# Patient Record
Sex: Female | Born: 1985 | Race: White | Hispanic: No | Marital: Single | Smoking: Never smoker
Health system: Southern US, Community
[De-identification: ages and names within clinical notes are randomized; demographics above are authoritative.]

## PROBLEM LIST (undated history)

## (undated) DIAGNOSIS — F32A Depression, unspecified: Secondary | ICD-10-CM

## (undated) DIAGNOSIS — T7840XA Allergy, unspecified, initial encounter: Secondary | ICD-10-CM

## (undated) DIAGNOSIS — F419 Anxiety disorder, unspecified: Secondary | ICD-10-CM

## (undated) DIAGNOSIS — G5710 Meralgia paresthetica, unspecified lower limb: Secondary | ICD-10-CM

## (undated) DIAGNOSIS — K219 Gastro-esophageal reflux disease without esophagitis: Secondary | ICD-10-CM

## (undated) DIAGNOSIS — R51 Headache: Secondary | ICD-10-CM

## (undated) DIAGNOSIS — Z309 Encounter for contraceptive management, unspecified: Secondary | ICD-10-CM

## (undated) DIAGNOSIS — R519 Headache, unspecified: Secondary | ICD-10-CM

## (undated) DIAGNOSIS — F329 Major depressive disorder, single episode, unspecified: Secondary | ICD-10-CM

## (undated) HISTORY — DX: Gastro-esophageal reflux disease without esophagitis: K21.9

## (undated) HISTORY — DX: Encounter for contraceptive management, unspecified: Z30.9

## (undated) HISTORY — DX: Depression, unspecified: F32.A

## (undated) HISTORY — PX: WISDOM TOOTH EXTRACTION: SHX21

## (undated) HISTORY — DX: Major depressive disorder, single episode, unspecified: F32.9

## (undated) HISTORY — DX: Allergy, unspecified, initial encounter: T78.40XA

## (undated) HISTORY — DX: Anxiety disorder, unspecified: F41.9

## (undated) HISTORY — DX: Meralgia paresthetica, unspecified lower limb: G57.10

## (undated) HISTORY — DX: Headache, unspecified: R51.9

## (undated) HISTORY — DX: Headache: R51

---

## 2015-11-07 ENCOUNTER — Ambulatory Visit: Payer: Self-pay | Admitting: Family Medicine

## 2015-11-11 ENCOUNTER — Ambulatory Visit (INDEPENDENT_AMBULATORY_CARE_PROVIDER_SITE_OTHER): Payer: 59 | Admitting: Family Medicine

## 2015-11-11 ENCOUNTER — Encounter: Payer: Self-pay | Admitting: Family Medicine

## 2015-11-11 VITALS — BP 122/78 | HR 90 | Temp 98.2°F | Ht 67.0 in | Wt 229.2 lb

## 2015-11-11 DIAGNOSIS — Z0000 Encounter for general adult medical examination without abnormal findings: Secondary | ICD-10-CM | POA: Diagnosis not present

## 2015-11-11 DIAGNOSIS — E669 Obesity, unspecified: Secondary | ICD-10-CM | POA: Diagnosis not present

## 2015-11-11 DIAGNOSIS — Z130 Encounter for screening for diseases of the blood and blood-forming organs and certain disorders involving the immune mechanism: Secondary | ICD-10-CM

## 2015-11-11 LAB — HEMOGLOBIN A1C: Hgb A1c MFr Bld: 5.6 % (ref 4.6–6.5)

## 2015-11-11 LAB — CBC
HCT: 37 % (ref 36.0–46.0)
HEMOGLOBIN: 12.6 g/dL (ref 12.0–15.0)
MCHC: 34.1 g/dL (ref 30.0–36.0)
MCV: 90.5 fl (ref 78.0–100.0)
Platelets: 354 10*3/uL (ref 150.0–400.0)
RBC: 4.08 Mil/uL (ref 3.87–5.11)
RDW: 12.3 % (ref 11.5–15.5)
WBC: 6.8 10*3/uL (ref 4.0–10.5)

## 2015-11-11 LAB — COMPREHENSIVE METABOLIC PANEL
ALT: 25 U/L (ref 0–35)
AST: 18 U/L (ref 0–37)
Albumin: 4 g/dL (ref 3.5–5.2)
Alkaline Phosphatase: 62 U/L (ref 39–117)
BILIRUBIN TOTAL: 0.2 mg/dL (ref 0.2–1.2)
BUN: 16 mg/dL (ref 6–23)
CO2: 30 meq/L (ref 19–32)
Calcium: 9.4 mg/dL (ref 8.4–10.5)
Chloride: 104 mEq/L (ref 96–112)
Creatinine, Ser: 0.54 mg/dL (ref 0.40–1.20)
GFR: 142.7 mL/min (ref >60.00)
Glucose, Bld: 93 mg/dL (ref 70–99)
Potassium: 4 mEq/L (ref 3.5–5.1)
SODIUM: 139 meq/L (ref 135–145)
Total Protein: 7.2 g/dL (ref 6.0–8.3)

## 2015-11-11 LAB — LIPID PANEL
Cholesterol: 139 mg/dL (ref 0–200)
HDL: 48 mg/dL (ref >39.00)
LDL Cholesterol: 80 mg/dL (ref 0–99)
NONHDL: 91.2
Total CHOL/HDL Ratio: 3
Triglycerides: 55 mg/dL (ref 0.0–149.0)
VLDL: 11 mg/dL (ref 0.0–40.0)

## 2015-11-11 NOTE — Patient Instructions (Addendum)
It was nice to see you today.  Please follow up later this year for your pap smear.  We will call with your lab results.   Follow up:  Return for Later this year for pap smear.  Take care  Dr. Lacinda Axon  Health Maintenance, Female Adopting a healthy lifestyle and getting preventive care can go a long way to promote health and wellness. Talk with your health care provider about what schedule of regular examinations is right for you. This is a good chance for you to check in with your provider about disease prevention and staying healthy. In between checkups, there are plenty of things you can do on your own. Experts have done a lot of research about which lifestyle changes and preventive measures are most likely to keep you healthy. Ask your health care provider for more information. WEIGHT AND DIET  Eat a healthy diet  Be sure to include plenty of vegetables, fruits, low-fat dairy products, and lean protein.  Do not eat a lot of foods high in solid fats, added sugars, or salt.  Get regular exercise. This is one of the most important things you can do for your health.  Most adults should exercise for at least 150 minutes each week. The exercise should increase your heart rate and make you sweat (moderate-intensity exercise).  Most adults should also do strengthening exercises at least twice a week. This is in addition to the moderate-intensity exercise.  Maintain a healthy weight  Body mass index (BMI) is a measurement that can be used to identify possible weight problems. It estimates body fat based on height and weight. Your health care provider can help determine your BMI and help you achieve or maintain a healthy weight.  For females 89 years of age and older:   A BMI below 18.5 is considered underweight.  A BMI of 18.5 to 24.9 is normal.  A BMI of 25 to 29.9 is considered overweight.  A BMI of 30 and above is considered obese.  Watch levels of cholesterol and blood  lipids  You should start having your blood tested for lipids and cholesterol at 28 years of age, then have this test every 5 years.  You may need to have your cholesterol levels checked more often if:  Your lipid or cholesterol levels are high.  You are older than 28 years of age.  You are at high risk for heart disease.  CANCER SCREENING   Lung Cancer  Lung cancer screening is recommended for adults 16-64 years old who are at high risk for lung cancer because of a history of smoking.  A yearly low-dose CT scan of the lungs is recommended for people who:  Currently smoke.  Have quit within the past 15 years.  Have at least a 30-pack-year history of smoking. A pack year is smoking an average of one pack of cigarettes a day for 1 year.  Yearly screening should continue until it has been 15 years since you quit.  Yearly screening should stop if you develop a health problem that would prevent you from having lung cancer treatment.  Breast Cancer  Practice breast self-awareness. This means understanding how your breasts normally appear and feel.  It also means doing regular breast self-exams. Let your health care provider know about any changes, no matter how small.  If you are in your 20s or 30s, you should have a clinical breast exam (CBE) by a health care provider every 1-3 years as part of a  regular health exam.  If you are 40 or older, have a CBE every year. Also consider having a breast X-ray (mammogram) every year.  If you have a family history of breast cancer, talk to your health care provider about genetic screening.  If you are at high risk for breast cancer, talk to your health care provider about having an MRI and a mammogram every year.  Breast cancer gene (BRCA) assessment is recommended for women who have family members with BRCA-related cancers. BRCA-related cancers include:  Breast.  Ovarian.  Tubal.  Peritoneal cancers.  Results of the assessment  will determine the need for genetic counseling and BRCA1 and BRCA2 testing. Cervical Cancer Your health care provider may recommend that you be screened regularly for cancer of the pelvic organs (ovaries, uterus, and vagina). This screening involves a pelvic examination, including checking for microscopic changes to the surface of your cervix (Pap test). You may be encouraged to have this screening done every 3 years, beginning at age 84.  For women ages 99-65, health care providers may recommend pelvic exams and Pap testing every 3 years, or they may recommend the Pap and pelvic exam, combined with testing for human papilloma virus (HPV), every 5 years. Some types of HPV increase your risk of cervical cancer. Testing for HPV may also be done on women of any age with unclear Pap test results.  Other health care providers may not recommend any screening for nonpregnant women who are considered low risk for pelvic cancer and who do not have symptoms. Ask your health care provider if a screening pelvic exam is right for you.  If you have had past treatment for cervical cancer or a condition that could lead to cancer, you need Pap tests and screening for cancer for at least 20 years after your treatment. If Pap tests have been discontinued, your risk factors (such as having a new sexual partner) need to be reassessed to determine if screening should resume. Some women have medical problems that increase the chance of getting cervical cancer. In these cases, your health care provider may recommend more frequent screening and Pap tests. Colorectal Cancer  This type of cancer can be detected and often prevented.  Routine colorectal cancer screening usually begins at 28 years of age and continues through 28 years of age.  Your health care provider may recommend screening at an earlier age if you have risk factors for colon cancer.  Your health care provider may also recommend using home test kits to check  for hidden blood in the stool.  A small camera at the end of a tube can be used to examine your colon directly (sigmoidoscopy or colonoscopy). This is done to check for the earliest forms of colorectal cancer.  Routine screening usually begins at age 6.  Direct examination of the colon should be repeated every 5-10 years through 28 years of age. However, you may need to be screened more often if early forms of precancerous polyps or small growths are found. Skin Cancer  Check your skin from head to toe regularly.  Tell your health care provider about any new moles or changes in moles, especially if there is a change in a mole's shape or color.  Also tell your health care provider if you have a mole that is larger than the size of a pencil eraser.  Always use sunscreen. Apply sunscreen liberally and repeatedly throughout the day.  Protect yourself by wearing long sleeves, pants, a wide-brimmed hat,  and sunglasses whenever you are outside. HEART DISEASE, DIABETES, AND HIGH BLOOD PRESSURE   High blood pressure causes heart disease and increases the risk of stroke. High blood pressure is more likely to develop in:  People who have blood pressure in the high end of the normal range (130-139/85-89 mm Hg).  People who are overweight or obese.  People who are African American.  If you are 39-52 years of age, have your blood pressure checked every 3-5 years. If you are 19 years of age or older, have your blood pressure checked every year. You should have your blood pressure measured twice--once when you are at a hospital or clinic, and once when you are not at a hospital or clinic. Record the average of the two measurements. To check your blood pressure when you are not at a hospital or clinic, you can use:  An automated blood pressure machine at a pharmacy.  A home blood pressure monitor.  If you are between 31 years and 2 years old, ask your health care provider if you should take  aspirin to prevent strokes.  Have regular diabetes screenings. This involves taking a blood sample to check your fasting blood sugar level.  If you are at a normal weight and have a low risk for diabetes, have this test once every three years after 29 years of age.  If you are overweight and have a high risk for diabetes, consider being tested at a younger age or more often. PREVENTING INFECTION  Hepatitis B  If you have a higher risk for hepatitis B, you should be screened for this virus. You are considered at high risk for hepatitis B if:  You were born in a country where hepatitis B is common. Ask your health care provider which countries are considered high risk.  Your parents were born in a high-risk country, and you have not been immunized against hepatitis B (hepatitis B vaccine).  You have HIV or AIDS.  You use needles to inject street drugs.  You live with someone who has hepatitis B.  You have had sex with someone who has hepatitis B.  You get hemodialysis treatment.  You take certain medicines for conditions, including cancer, organ transplantation, and autoimmune conditions. Hepatitis C  Blood testing is recommended for:  Everyone born from [birth year] through 1963.  Anyone with known risk factors for hepatitis C. Sexually transmitted infections (STIs)  You should be screened for sexually transmitted infections (STIs) including gonorrhea and chlamydia if:  You are sexually active and are younger than 28 years of age.  You are older than 28 years of age and your health care provider tells you that you are at risk for this type of infection.  Your sexual activity has changed since you were last screened and you are at an increased risk for chlamydia or gonorrhea. Ask your health care provider if you are at risk.  If you do not have HIV, but are at risk, it may be recommended that you take a prescription medicine daily to prevent HIV infection. This is called  pre-exposure prophylaxis (PrEP). You are considered at risk if:  You are sexually active and do not regularly use condoms or know the HIV status of your partner(s).  You take drugs by injection.  You are sexually active with a partner who has HIV. Talk with your health care provider about whether you are at high risk of being infected with HIV. If you choose to begin PrEP, you should  first be tested for HIV. You should then be tested every 3 months for as long as you are taking PrEP.  PREGNANCY   If you are premenopausal and you may become pregnant, ask your health care provider about preconception counseling.  If you may become pregnant, take 400 to 800 micrograms (mcg) of folic acid every day.  If you want to prevent pregnancy, talk to your health care provider about birth control (contraception). OSTEOPOROSIS AND MENOPAUSE   Osteoporosis is a disease in which the bones lose minerals and strength with aging. This can result in serious bone fractures. Your risk for osteoporosis can be identified using a bone density scan.  If you are 2 years of age or older, or if you are at risk for osteoporosis and fractures, ask your health care provider if you should be screened.  Ask your health care provider whether you should take a calcium or vitamin D supplement to lower your risk for osteoporosis.  Menopause may have certain physical symptoms and risks.  Hormone replacement therapy may reduce some of these symptoms and risks. Talk to your health care provider about whether hormone replacement therapy is right for you.  HOME CARE INSTRUCTIONS   Schedule regular health, dental, and eye exams.  Stay current with your immunizations.   Do not use any tobacco products including cigarettes, chewing tobacco, or electronic cigarettes.  If you are pregnant, do not drink alcohol.  If you are breastfeeding, limit how much and how often you drink alcohol.  Limit alcohol intake to no more than 1  drink per day for nonpregnant women. One drink equals 12 ounces of beer, 5 ounces of wine, or 1 ounces of hard liquor.  Do not use street drugs.  Do not share needles.  Ask your health care provider for help if you need support or information about quitting drugs.  Tell your health care provider if you often feel depressed.  Tell your health care provider if you have ever been abused or do not feel safe at home.   This information is not intended to replace advice given to you by your health care provider. Make sure you discuss any questions you have with your health care provider.   Document Released: 02/08/2011 Document Revised: 08/16/2014 Document Reviewed: 06/27/2013 Elsevier Interactive Patient Education [employer].

## 2015-11-11 NOTE — Progress Notes (Signed)
Pre visit review using our clinic review tool, if applicable. No additional management support is needed unless otherwise documented below in the visit note. 

## 2015-11-11 NOTE — Assessment & Plan Note (Signed)
Flu up to date. Tdap up to date per report. Regular exercise. Screening labs today. Needs to follow up later this year for pap smear.

## 2015-11-11 NOTE — Progress Notes (Signed)
Subjective:  Patient ID: Kathleen Lambert, female    DOB: 06/17/1986  Age: 30 y.o. MRN: [id number]  CC: Establish care  HPI Kathleen Lambert is a 30 y.o. female presents to the clinic today to establish care.   Preventative Healthcare  Pap smear: ~2011; in need of.  Immunizations  Tetanus - Report this was in the last 10 years.  Flu - Up to date.   Labs: Screening labs today.  Exercise: yes, regular exercise.   Alcohol use: See below.   Smoking/tobacco use: See below.   Wears seat belt: yes.   PMH, Surgical Hx, Family Hx, Social History reviewed and updated as below.  Past Medical History  Diagnosis Date  . Allergy   . Frequent headaches    Past Surgical History  Procedure Laterality Date  . Wisdom tooth extraction     Family History  Problem Relation Age of Onset  . Mental illness Mother   . Hypertension Father   . Breast cancer Paternal Aunt   . Lung cancer Paternal Uncle   . Diabetes Maternal Grandmother   . Heart disease Maternal Grandfather   . Heart disease Paternal Grandfather   . Stroke Paternal Grandfather    Social History  Substance Use Topics  . Smoking status: Never Smoker   . Smokeless tobacco: Never Used  . Alcohol Use: Not on file    Review of Systems  Neurological: Positive for headaches.  All other systems reviewed and are negative.  Objective:   Today's Vitals: BP 122/78 mmHg  Pulse 90  Temp(Src) 98.2 F (36.8 C) (Oral)  Ht 5\' 7"  (1.702 m)  Wt 229 lb 4 oz (103.987 kg)  BMI 35.90 kg/m2  SpO2 97%  LMP 10/31/2015  Physical Exam  Constitutional: She is oriented to person, place, and time. She appears well-developed and well-nourished. No distress.  HENT:  Head: Normocephalic and atraumatic.  Nose: Nose normal.  Mouth/Throat: Oropharynx is clear and moist. No oropharyngeal exudate.  Normal TM's bilaterally.   Eyes: Conjunctivae are normal. No scleral icterus.  Neck: Neck supple. No thyromegaly present.  Cardiovascular:  Normal rate and regular rhythm.   No murmur heard. Pulmonary/Chest: Effort normal and breath sounds normal. She has no wheezes. She has no rales.  Abdominal: Soft. She exhibits no distension. There is no tenderness. There is no rebound and no guarding.  Musculoskeletal: Normal range of motion. She exhibits no edema.  Lymphadenopathy:    She has no cervical adenopathy.  Neurological: She is alert and oriented to person, place, and time.  Skin: Skin is warm and dry. No rash noted.  Psychiatric: She has a normal mood and affect.  Vitals reviewed.  Assessment & Plan:   Problem List Items Addressed This Visit    Preventative health care - Primary    Flu up to date. Tdap up to date per report. Regular exercise. Screening labs today. Needs to follow up later this year for pap smear.       Other Visit Diagnoses    Screening for deficiency anemia        Relevant Orders    CBC (Completed)    Obesity (BMI 35.0-39.9 without comorbidity) (HCC)        Relevant Orders    Comprehensive metabolic panel (Completed)    Lipid Profile (Completed)    HgB A1c (Completed)      Follow-up: Return for Later this year for pap smear.  Kathleen OtherJayce Alexsis Branscom DO [facility] [place]

## 2016-01-20 DIAGNOSIS — H5213 Myopia, bilateral: Secondary | ICD-10-CM | POA: Diagnosis not present

## 2016-01-20 DIAGNOSIS — H52222 Regular astigmatism, left eye: Secondary | ICD-10-CM | POA: Diagnosis not present

## 2016-02-05 ENCOUNTER — Telehealth: Payer: 59 | Admitting: Family

## 2016-02-05 DIAGNOSIS — B9689 Other specified bacterial agents as the cause of diseases classified elsewhere: Secondary | ICD-10-CM

## 2016-02-05 DIAGNOSIS — J019 Acute sinusitis, unspecified: Secondary | ICD-10-CM | POA: Diagnosis not present

## 2016-02-05 MED ORDER — AMOXICILLIN-POT CLAVULANATE 875-125 MG PO TABS: 1.0000 | ORAL_TABLET | Freq: Two times a day (BID) | ORAL | Status: DC

## 2016-02-05 MED ORDER — DOXYCYCLINE HYCLATE 100 MG PO TABS: 100.0000 mg | ORAL_TABLET | Freq: Two times a day (BID) | ORAL | Status: DC

## 2016-02-05 NOTE — Addendum Note (Signed)
Addended by: Jannifer RodneyHAWKS, Teryl Mcconaghy A on: 02/05/2016 09:42 PM   Modules accepted: Orders

## 2016-02-05 NOTE — Progress Notes (Signed)

## 2016-02-09 ENCOUNTER — Ambulatory Visit (INDEPENDENT_AMBULATORY_CARE_PROVIDER_SITE_OTHER): Payer: 59 | Admitting: Family Medicine

## 2016-02-09 ENCOUNTER — Other Ambulatory Visit (HOSPITAL_COMMUNITY)
Admission: RE | Admit: 2016-02-09 | Discharge: 2016-02-09 | Disposition: A | Discharge status: Home / Self Care | Payer: 59 | Source: Ambulatory Visit | Attending: Family Medicine | Admitting: Family Medicine

## 2016-02-09 ENCOUNTER — Encounter: Payer: Self-pay | Admitting: Family Medicine

## 2016-02-09 VITALS — BP 126/74 | HR 48 | Temp 98.4°F | Wt 232.0 lb

## 2016-02-09 DIAGNOSIS — Z1151 Encounter for screening for human papillomavirus (HPV): Secondary | ICD-10-CM | POA: Insufficient documentation

## 2016-02-09 DIAGNOSIS — N649 Disorder of breast, unspecified: Secondary | ICD-10-CM

## 2016-02-09 DIAGNOSIS — N6459 Other signs and symptoms in breast: Secondary | ICD-10-CM

## 2016-02-09 DIAGNOSIS — Z01419 Encounter for gynecological examination (general) (routine) without abnormal findings: Secondary | ICD-10-CM | POA: Insufficient documentation

## 2016-02-09 DIAGNOSIS — Z124 Encounter for screening for malignant neoplasm of cervix: Secondary | ICD-10-CM

## 2016-02-09 NOTE — Progress Notes (Signed)
Pre visit review using our clinic review tool, if applicable. No additional management support is needed unless otherwise documented below in the visit note. 

## 2016-02-09 NOTE — Patient Instructions (Signed)
We will call with your pap results.  Nothing to worry about regarding your breasts.  Follow up annually  Take care  Dr. Adriana Simasook

## 2016-02-09 NOTE — Progress Notes (Signed)
   Subjective:  Patient ID: Kathleen Lambert, female    DOB: 11/05/1985  Age: 30 y.o. MRN: [id number]  CC: Pap smear, Breast concern  HPI:  30 year old female presents for a pap smear. Patient also has a concern about her left breast.  Last Pap smear was in 2011. She is in need of Pap smear today.  Patient reports that for the past few weeks she's noticed some dimpling of an area of her left breast. No reports of mass/lump, however patient states is difficult to discern as she has dense breasts. No reported nipple discharge. No reports of pain. No fevers, chills, weight loss. No other complaints today.  Social Hx   Social History   Social History  . Marital Status: Single    Spouse Name: N/A  . Number of Children: N/A  . Years of Education: N/A   Social History Main Topics  . Smoking status: Never Smoker   . Smokeless tobacco: Never Used  . Alcohol Use: None  . Drug Use: No  . Sexual Activity: Not Currently   Other Topics Concern  . None   Social History Narrative   Review of Systems  Constitutional: Negative.   Skin:       Breast dimpling (left).   Objective:  BP 126/74 mmHg  Pulse 48  Temp(Src) 98.4 F (36.9 C) (Oral)  Wt 232 lb (105.235 kg)  SpO2 98%  LMP 12/31/2015  BP/Weight 02/09/2016 11/11/2015  Systolic BP 126 122  Diastolic BP 74 78  Wt. (Lbs) 232 229.25  BMI 36.33 35.9   Physical Exam  Constitutional: She is oriented to person, place, and time. She appears well-developed. No distress.  Cardiovascular: Normal rate and regular rhythm.   Breasts: breasts appear normal, no appreciable masses, no skin or nipple changes or axillary nodes.    Pulmonary/Chest: Effort normal. She has no wheezes. She has no rales.  Genitourinary:  Pelvic Exam: External: normal female genitalia without lesions or masses Vagina: normal without lesions or masses Cervix: normal without lesions or masses. Pap smear: performed   Neurological: She is alert and oriented to person,  place, and time.  Psychiatric: She has a normal mood and affect.  Vitals reviewed.  Lab Results  Component Value Date   WBC 6.8 11/11/2015   HGB 12.6 11/11/2015   HCT 37.0 11/11/2015   PLT 354.0 11/11/2015   GLUCOSE 93 11/11/2015   CHOL 139 11/11/2015   TRIG 55.0 11/11/2015   HDL 48.00 11/11/2015   LDLCALC 80 11/11/2015   ALT 25 11/11/2015   AST 18 11/11/2015   NA 139 11/11/2015   K 4.0 11/11/2015   CL 104 11/11/2015   CREATININE 0.54 11/11/2015   BUN 16 11/11/2015   CO2 30 11/11/2015   HGBA1C 5.6 11/11/2015    Assessment & Plan:   Problem List Items Addressed This Visit    Breast complaint - Primary    New problem. Normal breast exam today. Reassured.      Screening for malignant neoplasm of cervix    Pap smear performed today.      Relevant Orders   Cytology - PAP     Follow-up: Annually  Everlene OtherJayce Barabara Motz DO [facility] [place]

## 2016-02-10 DIAGNOSIS — N6459 Other signs and symptoms in breast: Secondary | ICD-10-CM | POA: Insufficient documentation

## 2016-02-11 DIAGNOSIS — Z124 Encounter for screening for malignant neoplasm of cervix: Secondary | ICD-10-CM | POA: Insufficient documentation

## 2016-02-11 NOTE — Assessment & Plan Note (Signed)
New problem. Normal breast exam today. Reassured.

## 2016-02-11 NOTE — Assessment & Plan Note (Signed)
Pap smear performed today. 

## 2016-02-12 LAB — CYTOLOGY - PAP

## 2016-05-16 ENCOUNTER — Telehealth: Payer: 59 | Admitting: Family

## 2016-05-16 DIAGNOSIS — R112 Nausea with vomiting, unspecified: Secondary | ICD-10-CM

## 2016-05-16 MED ORDER — ONDANSETRON 4 MG PO TBDP: 4.0000 mg | ORAL_TABLET | Freq: Three times a day (TID) | ORAL | 0 refills | Status: DC | PRN

## 2016-05-16 NOTE — Progress Notes (Signed)

## 2016-06-11 ENCOUNTER — Ambulatory Visit (INDEPENDENT_AMBULATORY_CARE_PROVIDER_SITE_OTHER): Payer: 59

## 2016-06-11 ENCOUNTER — Ambulatory Visit (INDEPENDENT_AMBULATORY_CARE_PROVIDER_SITE_OTHER): Payer: 59 | Admitting: Podiatry

## 2016-06-11 VITALS — BP 109/64 | HR 70 | Temp 98.1°F | Resp 16 | Ht 68.0 in | Wt 235.0 lb

## 2016-06-11 DIAGNOSIS — M7672 Peroneal tendinitis, left leg: Secondary | ICD-10-CM

## 2016-06-11 DIAGNOSIS — M722 Plantar fascial fibromatosis: Secondary | ICD-10-CM | POA: Diagnosis not present

## 2016-06-11 DIAGNOSIS — R52 Pain, unspecified: Secondary | ICD-10-CM

## 2016-06-11 DIAGNOSIS — M79673 Pain in unspecified foot: Secondary | ICD-10-CM | POA: Diagnosis not present

## 2016-06-11 DIAGNOSIS — M7751 Other enthesopathy of right foot: Secondary | ICD-10-CM

## 2016-06-11 DIAGNOSIS — M79671 Pain in right foot: Secondary | ICD-10-CM

## 2016-06-11 NOTE — Progress Notes (Signed)
   Subjective:    Patient ID: Kathleen GoldsAlicia A Nied, female    DOB: 08/22/1985, 30 y.o.   MRN: [id number]  HPI    Review of Systems  All other systems reviewed and are negative.      Objective:   Physical Exam        Assessment & Plan:

## 2016-06-12 MED ORDER — BETAMETHASONE SOD PHOS & ACET 6 (3-3) MG/ML IJ SUSP: 3.0000 mg | Freq: Once | INTRAMUSCULAR | Status: DC

## 2016-06-12 NOTE — Progress Notes (Signed)
Patient ID: Kathleen GoldsAlicia [name], female   DOB: 06/23/1986, 30 y.o.   MRN: [id number] Subjective: Patient presents today for pain and tenderness in the right foot. Patient states the foot pain has been hurting for several weeks now. Patient states that it hurts in the mornings with the first steps out of bed. Patient also states that she has significant pain to the lateral aspect of the left foot. Pain is been ongoing for several weeks now. Patient presents today for further treatment and evaluation  Objective: Physical Exam General: The patient is alert and oriented x3 in no acute distress.  Dermatology: Skin is warm, dry and supple bilateral lower extremities. Negative for open lesions or macerations bilateral.   Vascular: Dorsalis Pedis and Posterior Tibial pulses palpable bilateral.  Capillary fill time is immediate to all digits.  Neurological: Epicritic and protective threshold intact bilateral.   Musculoskeletal: Tenderness to palpation at the medial calcaneal tubercale and through the insertion of the plantar fascia of the right foot.  Pain on palpation also noted to the insertion of the peroneal brevis tendon at the level of the fifth metatarsal tubercle left foot.  All other joints range of motion within normal limits bilateral. Strength 5/5 in all groups bilateral.    Assessment: 1. Plantar fasciitis right 2. Pain in right foot 3. Peroneal brevis enthesopathy left 4. Pain in left foot  Plan of Care:  1. Patient evaluated. Xrays reviewed.   2. Injection of 0.5cc Celestone soluspan injected into the right heel at the insertion of the plantar fascia.  3. Instructed patient regarding therapies and modalities at home to alleviate symptoms.  4. Injection of 0.5 mL Celestone Soluspan injected into the peroneal brevis tendon sheath at the insertion of the fifth metatarsal tubercle left foot. 6. Plantar fascial band(s) dispensed. 7. Today the patient was scanned for custom molded  orthotics. 8. Return to clinic in 4 weeks.

## 2016-07-06 ENCOUNTER — Ambulatory Visit (INDEPENDENT_AMBULATORY_CARE_PROVIDER_SITE_OTHER): Payer: 59 | Admitting: Podiatry

## 2016-07-06 DIAGNOSIS — M722 Plantar fascial fibromatosis: Secondary | ICD-10-CM

## 2016-07-06 NOTE — Patient Instructions (Signed)

## 2016-07-11 NOTE — Progress Notes (Signed)
Patient presents for orthotics pickup. Orthotics were dispensed today with both verbal and written instructions for orthotics break-in period and care. Return to clinic in 3 weeks for follow-up evaluation.  Brent M. Evans, DPM [facility]  Dr. Brent M. Evans, DPM   [address]                                        [place], [place] [address]                Office [phone]  Fax [phone]   

## 2016-07-12 ENCOUNTER — Encounter: Payer: Self-pay | Admitting: *Deleted

## 2016-08-03 ENCOUNTER — Encounter: Payer: Self-pay | Admitting: Podiatry

## 2016-08-03 ENCOUNTER — Ambulatory Visit (INDEPENDENT_AMBULATORY_CARE_PROVIDER_SITE_OTHER): Payer: 59 | Admitting: Podiatry

## 2016-08-03 DIAGNOSIS — M7672 Peroneal tendinitis, left leg: Secondary | ICD-10-CM | POA: Diagnosis not present

## 2016-08-03 DIAGNOSIS — M79671 Pain in right foot: Secondary | ICD-10-CM

## 2016-08-03 DIAGNOSIS — M7751 Other enthesopathy of right foot: Secondary | ICD-10-CM | POA: Diagnosis not present

## 2016-08-03 DIAGNOSIS — M722 Plantar fascial fibromatosis: Secondary | ICD-10-CM

## 2016-08-03 MED ORDER — MELOXICAM 15 MG PO TABS: 15.0000 mg | ORAL_TABLET | Freq: Every day | ORAL | 1 refills | Status: DC

## 2016-08-07 NOTE — Progress Notes (Signed)
Patient ID: Kathleen GoldsAlicia [name], female   DOB: 07/30/1986, 30 y.o.   MRN: [id number] Subjective: Patient presents today for follow-up evaluation of plantar fasciitis to the right foot as well as peroneal enthesopathy of the left foot. Patient states that the peroneal enthesopathy to the left foot is the same. The condition does not improve or worsens. Patient states that her plantar fasciitis in her left foot has healed. She states that she has approximately 75% improvement on the right foot for plantar fasciitis. Patient presents today for follow-up evaluation and treatment  Objective: Physical Exam General: The patient is alert and oriented x3 in no acute distress.  Dermatology: Skin is warm, dry and supple bilateral lower extremities. Negative for open lesions or macerations bilateral.   Vascular: Dorsalis Pedis and Posterior Tibial pulses palpable bilateral.  Capillary fill time is immediate to all digits.  Neurological: Epicritic and protective threshold intact bilateral.   Musculoskeletal: Tenderness to palpation at the medial calcaneal tubercale and through the insertion of the plantar fascia of the right foot.  Pain on palpation also noted to the insertion of the peroneal brevis tendon at the level of the fifth metatarsal tubercle left foot.  All other joints range of motion within normal limits bilateral. Strength 5/5 in all groups bilateral.    Assessment: 1. Plantar fasciitis right-improved 2. Pain in right foot 3. Peroneal brevis enthesopathy left 4. Pain in left foot  Plan of Care:  1. Patient evaluated.   2. Prescription for meloxicam 15 mg #60 one refill 3. Continue custom molded orthotics, plantar fascial band as needed 4. Return to clinic in 4 weeks  Patient is a ER nurse at the [employer]

## 2016-08-31 ENCOUNTER — Ambulatory Visit (INDEPENDENT_AMBULATORY_CARE_PROVIDER_SITE_OTHER): Payer: 59 | Admitting: Podiatry

## 2016-08-31 DIAGNOSIS — M722 Plantar fascial fibromatosis: Secondary | ICD-10-CM | POA: Diagnosis not present

## 2016-08-31 DIAGNOSIS — M7672 Peroneal tendinitis, left leg: Secondary | ICD-10-CM | POA: Diagnosis not present

## 2016-08-31 NOTE — Progress Notes (Signed)
   Subjective:  Patient presents today for follow-up evaluation of plantar fasciitis to the right foot as well as peroneal brevis enthesopathy left foot. 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 Patient states she's doing a lot better. Patient states the orthotics and meloxicam has helped.    Objective/Physical Exam General: The patient is alert and oriented x3 in no acute distress.  Dermatology: Skin is warm, dry and supple bilateral lower extremities. Negative for open lesions or macerations.  Vascular: Palpable pedal pulses bilaterally. No edema or erythema noted. Capillary refill within normal limits.  Neurological: Epicritic and protective threshold grossly intact bilaterally.   Musculoskeletal Exam: Range of motion within normal limits to all pedal and ankle joints bilateral. Muscle strength 5/5 in all groups bilateral.  Negative for pain on palpation to the plantar fascia of the right foot as well as insertion of the peroneal brevis tendon left.  Assessment: #1 plantar fasciitis right foot-healed Exline #2 peroneal brevis enthesopathy left-healed   Plan of Care:  #1 Patient was evaluated. #2 return to clinic when necessary   Felecia ShellingBrent M. Jennene Downie, DPM [facility]  Dr. Felecia ShellingBrent M. Jillana Selph, DPM    [address]                                        [place], [place] [address]                Office [phone]  Fax [phone]

## 2016-10-15 ENCOUNTER — Telehealth: Payer: 59 | Admitting: Family

## 2016-10-15 DIAGNOSIS — J029 Acute pharyngitis, unspecified: Secondary | ICD-10-CM | POA: Diagnosis not present

## 2016-10-15 MED ORDER — BENZONATATE 100 MG PO CAPS: 100.0000 mg | ORAL_CAPSULE | Freq: Three times a day (TID) | ORAL | 0 refills | Status: DC | PRN

## 2016-10-15 NOTE — Progress Notes (Signed)
We are sorry that you are not feeling well.  Here is how we plan to help!  Based on what you have shared with me it looks like you have upper respiratory tract inflammation that has resulted in a significant cough.  Inflammation and infection in the upper respiratory tract is commonly called bronchitis and has four common causes:  Allergies, Viral Infections, Acid Reflux and Bacterial Infections.  Allergies, viruses and acid reflux are treated by controlling symptoms or eliminating the cause. An example might be a cough caused by taking certain blood pressure medications. You stop the cough by changing the medication. Another example might be a cough caused by acid reflux. Controlling the reflux helps control the cough.  Based on your presentation I believe you most likely have A cough due to a virus.  This is called viral bronchitis and is best treated by rest, plenty of fluids and control of the cough.  You may use Ibuprofen or Tylenol as directed to help your symptoms.     In addition you may use A non-prescription cough medication called Mucinex DM: take 2 tablets every 12 hours. and A prescription cough medication called Tessalon Perles 100mg. You may take 1-2 capsules every 8 hours as needed for your cough.   USE OF BRONCHODILATOR ("RESCUE") INHALERS: There is a risk from using your bronchodilator too frequently.  The risk is that over-reliance on a medication which only relaxes the muscles surrounding the breathing tubes can reduce the effectiveness of medications prescribed to reduce swelling and congestion of the tubes themselves.  Although you feel brief relief from the bronchodilator inhaler, your asthma may actually be worsening with the tubes becoming more swollen and filled with mucus.  This can delay other crucial treatments, such as oral steroid medications. If you need to use a bronchodilator inhaler daily, several times per day, you should discuss this with your provider.  There are  probably better treatments that could be used to keep your asthma under control.     HOME CARE . Only take medications as instructed by your medical team. . Complete the entire course of an antibiotic. . Drink plenty of fluids and get plenty of rest. . Avoid close contacts especially the very young and the elderly . Cover your mouth if you cough or cough into your sleeve. . Always remember to wash your hands . A steam or ultrasonic humidifier can help congestion.   GET HELP RIGHT AWAY IF: . You develop worsening fever. . You become short of breath . You cough up blood. . Your symptoms persist after you have completed your treatment plan MAKE SURE YOU   Understand these instructions.  Will watch your condition.  Will get help right away if you are not doing well or get worse.  Your e-visit answers were reviewed by a board certified advanced clinical practitioner to complete your personal care plan.  Depending on the condition, your plan could have included both over the counter or prescription medications. If there is a problem please reply  once you have received a response from your provider. Your safety is important to us.  If you have drug allergies check your prescription carefully.    You can use MyChart to ask questions about today's visit, request a non-urgent call back, or ask for a work or school excuse for 24 hours related to this e-Visit. If it has been greater than 24 hours you will need to follow up with your provider, or enter a new   e-Visit to address those concerns. You will get an e-mail in the next two days asking about your experience.  I hope that your e-visit has been valuable and will speed your recovery. Thank you for using e-visits.   

## 2017-01-20 ENCOUNTER — Telehealth: Payer: 59 | Admitting: Family

## 2017-01-20 DIAGNOSIS — R112 Nausea with vomiting, unspecified: Secondary | ICD-10-CM

## 2017-01-20 MED ORDER — ONDANSETRON HCL 4 MG PO TABS: 4.0000 mg | ORAL_TABLET | Freq: Three times a day (TID) | ORAL | 0 refills | Status: DC | PRN

## 2017-01-20 NOTE — Progress Notes (Signed)
Thank you for the details you put in the comment boxes. Those details really help us take better care of you.   We are sorry that you are not feeling well. Here is how we plan to help!  Based on what you have shared with me it looks like you have a Virus that is irritating your GI tract.  Vomiting is the forceful emptying of a portion of the stomach's content through the mouth.  Although nausea and vomiting can make you feel miserable, it's important to remember that these are not diseases, but rather symptoms of an underlying illness.  When we treat short term symptoms, we always caution that any symptoms that persist should be fully evaluated in a medical office.  I have prescribed a medication that will help alleviate your symptoms and allow you to stay hydrated:  Zofran 4 mg 1 tablet every 8 hours as needed for nausea and vomiting  HOME CARE:  Drink clear liquids.  This is very important! Dehydration (the lack of fluid) can lead to a serious complication.  Start off with 1 tablespoon every 5 minutes for 8 hours.  You may begin eating bland foods after 8 hours without vomiting.  Start with saltine crackers, white bread, rice, mashed potatoes, applesauce.  After 48 hours on a bland diet, you may resume a normal diet.  Try to go to sleep.  Sleep often empties the stomach and relieves the need to vomit.  GET HELP RIGHT AWAY IF:   Your symptoms do not improve or worsen within 2 days after treatment.  You have a fever for over 3 days.  You cannot keep down fluids after trying the medication.  MAKE SURE YOU:   Understand these instructions.  Will watch your condition.  Will get help right away if you are not doing well or get worse.   Thank you for choosing an e-visit. Your e-visit answers were reviewed by a board certified advanced clinical practitioner to complete your personal care plan. Depending upon the condition, your plan could have included both over the counter or  prescription medications. Please review your pharmacy choice. Be sure that the pharmacy you have chosen is open so that you can pick up your prescription now.  If there is a problem you may message your provider in MyChart to have the prescription routed to another pharmacy. Your safety is important to us. If you have drug allergies check your prescription carefully.  For the next 24 hours, you can use MyChart to ask questions about today's visit, request a non-urgent call back, or ask for a work or school excuse from your e-visit provider. You will get an e-mail in the next two days asking about your experience. I hope that your e-visit has been valuable and will speed your recovery.    

## 2017-03-01 DIAGNOSIS — H52222 Regular astigmatism, left eye: Secondary | ICD-10-CM | POA: Diagnosis not present

## 2017-03-01 DIAGNOSIS — H5213 Myopia, bilateral: Secondary | ICD-10-CM | POA: Diagnosis not present

## 2017-03-07 ENCOUNTER — Ambulatory Visit (INDEPENDENT_AMBULATORY_CARE_PROVIDER_SITE_OTHER): Payer: 59 | Admitting: Family Medicine

## 2017-03-07 ENCOUNTER — Encounter: Payer: Self-pay | Admitting: Family Medicine

## 2017-03-07 VITALS — BP 100/70 | HR 67 | Temp 98.0°F | Ht 68.0 in | Wt 256.0 lb

## 2017-03-07 DIAGNOSIS — Z0000 Encounter for general adult medical examination without abnormal findings: Secondary | ICD-10-CM | POA: Diagnosis not present

## 2017-03-07 LAB — CBC
HCT: 38.2 % (ref 36.0–46.0)
Hemoglobin: 12.7 g/dL (ref 12.0–15.0)
MCHC: 33.2 g/dL (ref 30.0–36.0)
MCV: 91.6 fl (ref 78.0–100.0)
Platelets: 341 10*3/uL (ref 150.0–400.0)
RBC: 4.17 Mil/uL (ref 3.87–5.11)
RDW: 13 % (ref 11.5–15.5)
WBC: 6 10*3/uL (ref 4.0–10.5)

## 2017-03-07 LAB — COMPREHENSIVE METABOLIC PANEL
ALT: 14 U/L (ref 0–35)
AST: 13 U/L (ref 0–37)
Albumin: 3.7 g/dL (ref 3.5–5.2)
Alkaline Phosphatase: 69 U/L (ref 39–117)
BUN: 17 mg/dL (ref 6–23)
CHLORIDE: 105 meq/L (ref 96–112)
CO2: 23 mEq/L (ref 19–32)
Calcium: 8.3 mg/dL — ABNORMAL LOW (ref 8.4–10.5)
Creatinine, Ser: 0.56 mg/dL (ref 0.40–1.20)
GFR: 135.57 mL/min (ref >60.00)
Glucose, Bld: 101 mg/dL — ABNORMAL HIGH (ref 70–99)
POTASSIUM: 3.9 meq/L (ref 3.5–5.1)
SODIUM: 135 meq/L (ref 135–145)
Total Bilirubin: 0.3 mg/dL (ref 0.2–1.2)
Total Protein: 7.1 g/dL (ref 6.0–8.3)

## 2017-03-07 LAB — LIPID PANEL
CHOLESTEROL: 157 mg/dL (ref 0–200)
HDL: 52.9 mg/dL (ref >39.00)
LDL CALC: 92 mg/dL (ref 0–99)
NONHDL: 104.03
Total CHOL/HDL Ratio: 3
Triglycerides: 59 mg/dL (ref 0.0–149.0)
VLDL: 11.8 mg/dL (ref 0.0–40.0)

## 2017-03-07 LAB — HEMOGLOBIN A1C: HEMOGLOBIN A1C: 5.6 % (ref 4.6–6.5)

## 2017-03-07 LAB — TSH: TSH: 4.52 u[IU]/mL — AB (ref 0.35–4.50)

## 2017-03-07 NOTE — Progress Notes (Signed)
Subjective:  Patient ID: Kathleen GoldsAlicia [name], female    DOB: 08/22/1985  Age: 31 y.o. MRN: [id number]  CC: Annual physical exam  HPI Kathleen Lambert is a 31 y.o. female presents to the clinic today for an annual physical exam.  Preventative Healthcare  Pap smear: Up to date.   Immunizations  Tetanus - Up to date.   Labs: Screening labs today.  Alcohol use: No.  Smoking/tobacco use: No.  STD/HIV testing: Declines.   PMH, Surgical Hx, Family Hx, Social History reviewed and updated as below.  Past Medical History:  Diagnosis Date  . Allergy   . Frequent headaches    Past Surgical History:  Procedure Laterality Date  . WISDOM TOOTH EXTRACTION     Family History  Problem Relation Age of Onset  . Mental illness Mother   . Hypertension Father   . Breast cancer Paternal Aunt   . Lung cancer Paternal Uncle   . Diabetes Maternal Grandmother   . Heart disease Maternal Grandfather   . Heart disease Paternal Grandfather   . Stroke Paternal Grandfather    Social History  Substance Use Topics  . Smoking status: Never Smoker  . Smokeless tobacco: Never Used  . Alcohol use Not on file   Review of Systems General: Denies unexplained weight loss, fever. Skin: Denies new or changing mole, sore/wound that won't heal. ENT: Trouble hearing, ringing in the ears, sores in the mouth, hoarseness, trouble swallowing. Eyes: Denies trouble seeing/visual disturbance. Heart/CV: Denies chest pain, shortness of breath, edema, palpitations. Lungs/Resp: Denies cough, shortness of breath, hemoptysis. Abd/GI: Denies nausea, vomiting, diarrhea, constipation, abdominal pain, hematochezia, melena. GU: Denies dysuria, incontinence, hematuria, urinary frequency, difficulty starting/keeping stream, vaginal discharge, sexual difficulty, lump in breasts. MSK: Denies joint pain/swelling, myalgias. Neuro: Denies headaches, weakness, numbness, dizziness, syncope. Psych: Denies sadness, anxiety, stress,  memory difficulty. Endocrine: Denies polyuria and polydipsia.  Objective:   Today's Vitals: BP 100/70 (BP Location: Left Arm, Patient Position: Sitting, Cuff Size: Normal)   Pulse 67   Temp 98 F (36.7 C) (Oral)   Ht 5\' 8"  (1.727 m)   Wt 256 lb (116.1 kg)   SpO2 98%   BMI 38.92 kg/m   Physical Exam  Constitutional: She is oriented to person, place, and time. She appears well-developed and well-nourished. No distress.  HENT:  Head: Normocephalic and atraumatic.  Nose: Nose normal.  Mouth/Throat: Oropharynx is clear and moist. No oropharyngeal exudate.  Normal TM's bilaterally.   Eyes: Conjunctivae are normal. No scleral icterus.  Neck: Neck supple.  Cardiovascular: Normal rate and regular rhythm.   No murmur heard. Pulmonary/Chest: Effort normal and breath sounds normal. She has no wheezes. She has no rales.  Abdominal: Soft. She exhibits no distension. There is no tenderness. There is no rebound and no guarding.  Musculoskeletal: Normal range of motion. She exhibits no edema.  Lymphadenopathy:    She has no cervical adenopathy.  Neurological: She is alert and oriented to person, place, and time.  Skin: Skin is warm and dry. No rash noted.  Psychiatric: She has a normal mood and affect.  Vitals reviewed.  Assessment & Plan:   Problem List Items Addressed This Visit      Other   Annual physical exam - Primary    Screening labs today. Declines HIV screening. Pap up to date. Immunizations up to date.        Relevant Orders   CBC   Hemoglobin A1c   Comprehensive metabolic panel   Lipid  panel   TSH     Follow-up: Annually.   Everlene OtherJayce Masaji Billups DO [facility] [place]

## 2017-03-07 NOTE — Assessment & Plan Note (Addendum)
Screening labs today. Declines HIV screening. Pap up to date. Immunizations up to date.

## 2017-03-07 NOTE — Patient Instructions (Signed)

## 2017-03-08 ENCOUNTER — Telehealth: Payer: Self-pay | Admitting: Family Medicine

## 2017-03-08 NOTE — Telephone Encounter (Signed)
Returned patients call in regards to lab results  

## 2017-03-08 NOTE — Telephone Encounter (Signed)
Pt called returning your call. I verified pt number. Thank you!  Call pt @ [phone].

## 2017-03-10 ENCOUNTER — Telehealth: Payer: Self-pay | Admitting: Radiology

## 2017-03-10 ENCOUNTER — Other Ambulatory Visit: Payer: Self-pay | Admitting: Family Medicine

## 2017-03-10 DIAGNOSIS — R7989 Other specified abnormal findings of blood chemistry: Secondary | ICD-10-CM

## 2017-03-10 NOTE — Telephone Encounter (Signed)
Pt coming in for labs tomorrow, please place future orders. Thank you.  

## 2017-03-11 ENCOUNTER — Other Ambulatory Visit (INDEPENDENT_AMBULATORY_CARE_PROVIDER_SITE_OTHER): Payer: 59

## 2017-03-11 DIAGNOSIS — R7989 Other specified abnormal findings of blood chemistry: Secondary | ICD-10-CM

## 2017-03-11 DIAGNOSIS — R946 Abnormal results of thyroid function studies: Secondary | ICD-10-CM

## 2017-03-11 LAB — T4, FREE: Free T4: 0.75 ng/dL (ref 0.60–1.60)

## 2017-03-11 LAB — TSH: TSH: 2.8 u[IU]/mL (ref 0.35–4.50)

## 2017-03-14 ENCOUNTER — Telehealth: Payer: Self-pay

## 2017-03-17 NOTE — Telephone Encounter (Signed)
Error, duplicate

## 2017-08-09 DIAGNOSIS — G5710 Meralgia paresthetica, unspecified lower limb: Secondary | ICD-10-CM

## 2017-08-09 HISTORY — DX: Meralgia paresthetica, unspecified lower limb: G57.10

## 2017-08-16 ENCOUNTER — Ambulatory Visit (INDEPENDENT_AMBULATORY_CARE_PROVIDER_SITE_OTHER): Payer: No Typology Code available for payment source | Admitting: Physician Assistant

## 2017-08-16 ENCOUNTER — Other Ambulatory Visit: Payer: Self-pay | Admitting: Physician Assistant

## 2017-08-16 ENCOUNTER — Encounter: Payer: Self-pay | Admitting: Physician Assistant

## 2017-08-16 ENCOUNTER — Other Ambulatory Visit: Payer: Self-pay

## 2017-08-16 VITALS — BP 118/72 | HR 91 | Temp 98.8°F | Resp 18 | Ht 68.0 in | Wt 257.2 lb

## 2017-08-16 DIAGNOSIS — Z0000 Encounter for general adult medical examination without abnormal findings: Secondary | ICD-10-CM | POA: Diagnosis not present

## 2017-08-16 DIAGNOSIS — R208 Other disturbances of skin sensation: Secondary | ICD-10-CM

## 2017-08-16 DIAGNOSIS — R202 Paresthesia of skin: Secondary | ICD-10-CM | POA: Diagnosis not present

## 2017-08-16 DIAGNOSIS — Z113 Encounter for screening for infections with a predominantly sexual mode of transmission: Secondary | ICD-10-CM

## 2017-08-16 DIAGNOSIS — F329 Major depressive disorder, single episode, unspecified: Secondary | ICD-10-CM

## 2017-08-16 DIAGNOSIS — F419 Anxiety disorder, unspecified: Secondary | ICD-10-CM

## 2017-08-16 DIAGNOSIS — F32A Depression, unspecified: Secondary | ICD-10-CM

## 2017-08-16 MED ORDER — SERTRALINE HCL 50 MG PO TABS: 50.0000 mg | ORAL_TABLET | Freq: Every day | ORAL | 3 refills | Status: DC

## 2017-08-16 MED FILL — SERTRALINE HCL 50 MG TABLET: 50 | 90 days supply | Qty: 90 | Fill #0

## 2017-08-16 NOTE — Progress Notes (Signed)
Patient ID: Kathleen Lambert, female    DOB: 1986/03/03, 32 y.o.   MRN: [id number]  PCP: Tommie Sams, DO  Chief Complaint  Patient presents with  . Annual Exam    No pap needed. Last pap normal  . Depression    Depression scale score 13    Subjective:   Presents for [employer] Visit.  She had a Wellness visit in July, but has changed health insurance plans and desires same with her new coverage, and to establish here for primary care.  Cervical Cancer Screening: 02/2017 Breast Cancer Screening: not yet a candidate for mammogram. Monthly SBE. Annual CBE. Colorectal Cancer Screening: not yet a candidate Bone Density Testing: not yet a candidate HIV Screening: no previous testing STI Screening: no previous testing Seasonal Influenza Vaccination: current Td/Tdap Vaccination: 2016 Pneumococcal Vaccination: not yet a candidate Zoster Vaccination: not yet a candidate   There are no active problems to display for this patient.   Past Medical History:  Diagnosis Date  . Allergy   . Frequent headaches      Prior to Admission medications   Not on File    Allergies  Allergen Reactions  . Augmentin [Amoxicillin-Pot Clavulanate] Nausea Only    Past Surgical History:  Procedure Laterality Date  . WISDOM TOOTH EXTRACTION      Family History  Problem Relation Age of Onset  . Mental illness Mother        borderline personality  . Hypertension Father   . Hyperlipidemia Father   . Breast cancer Paternal Aunt   . Lung cancer Paternal Uncle   . Diabetes Maternal Grandmother   . Heart disease Maternal Grandmother   . Hypertension Maternal Grandmother   . Heart disease Maternal Grandfather   . Stroke Paternal Grandfather   . Heart disease Paternal Grandfather     Social History   Socioeconomic History  . Marital status: Divorced    Spouse name: None  . Number of children: None  . Years of education: None  . Highest education level: Bachelor's degree (e.g.,  BA, AB, BS)  Social Needs  . Financial resource strain: None  . Food insecurity - worry: None  . Food insecurity - inability: None  . Transportation needs - medical: None  . Transportation needs - non-medical: None  Occupational History  . Occupation: Charity fundraiser  Tobacco Use  . Smoking status: Never Smoker  . Smokeless tobacco: Never Used  Substance and Sexual Activity  . Alcohol use: Yes    Alcohol/week: 3.0 oz    Types: 5 Standard drinks or equivalent per week  . Drug use: No  . Sexual activity: Not Currently  Other Topics Concern  . None  Social History Narrative   Divorced.   Lives with her best friend and his husband.       Review of Systems  Constitutional: Negative.   HENT: Negative.   Eyes: Negative.   Respiratory: Negative.   Cardiovascular: Negative.   Gastrointestinal: Negative.   Endocrine: Negative.   Genitourinary: Negative.   Musculoskeletal: Negative.   Skin: Negative.   Allergic/Immunologic: Negative.   Neurological: Positive for numbness (RIGHT thigh x 3 weeks, intermittent, occurs with walking and RIGHT side lying).  Hematological: Negative.   Psychiatric/Behavioral: Positive for dysphoric mood (isolates self, feels sad more days than not) and sleep disturbance (difficulty falling asleep, sometimes awakens during the night). Negative for agitation, behavioral problems, confusion, decreased concentration, hallucinations, self-injury and suicidal ideas. The patient is nervous/anxious (no identified  triggers, feels anxious half the time). The patient is not hyperactive.     Depression screen [facility] 2/9 08/16/2017 02/09/2016  Decreased Interest 1 0  Down, Depressed, Hopeless 1 0  PHQ - 2 Score 2 0  Altered sleeping 3 -  Tired, decreased energy 3 -  Change in appetite 2 -  Feeling bad or failure about yourself  1 -  Trouble concentrating 1 -  Moving slowly or fidgety/restless 0 -  Suicidal thoughts 1 -  PHQ-9 Score 13 -  Difficult doing work/chores Somewhat  difficult -   GAD 7 : Generalized Anxiety Score 08/16/2017  Nervous, Anxious, on Edge 1  Control/stop worrying 1  Worry too much - different things 1  Trouble relaxing 1  Restless 2  Easily annoyed or irritable 1  Afraid - awful might happen 0  Total GAD 7 Score 7  Anxiety Difficulty Somewhat difficult        Objective:  Physical Exam  Constitutional: She is oriented to person, place, and time. She appears well-developed and well-nourished. She is active and cooperative. No distress.  BP 118/72 (BP Location: Left Arm, Patient Position: Sitting, Cuff Size: Large)   Pulse 91   Temp 98.8 F (37.1 C) (Oral)   Resp 18   Ht 5\' 8"  (1.727 m)   Wt 257 lb 3.2 oz (116.7 kg)   LMP 07/22/2017   SpO2 97%   BMI 39.11 kg/m   HENT:  Head: Normocephalic and atraumatic.  Right Ear: Hearing normal.  Left Ear: Hearing normal.  Eyes: Conjunctivae are normal. No scleral icterus.  Neck: Normal range of motion. Neck supple. No thyromegaly present.  Cardiovascular: Normal rate, regular rhythm and normal heart sounds.  Pulses:      Radial pulses are 2+ on the right side, and 2+ on the left side.  Pulmonary/Chest: Effort normal and breath sounds normal.  Lymphadenopathy:       Head (right side): No tonsillar, no preauricular, no posterior auricular and no occipital adenopathy present.       Head (left side): No tonsillar, no preauricular, no posterior auricular and no occipital adenopathy present.    She has no cervical adenopathy.       Right: No supraclavicular adenopathy present.       Left: No supraclavicular adenopathy present.  Neurological: She is alert and oriented to person, place, and time. No sensory deficit.  Skin: Skin is warm, dry and intact. No rash noted. No cyanosis or erythema. Nails show no clubbing.  Psychiatric: She has a normal mood and affect. Her speech is normal and behavior is normal.       Wt Readings from Last 3 Encounters:  08/16/17 257 lb 3.2 oz (116.7 kg)   03/07/17 256 lb (116.1 kg)  06/11/16 235 lb (106.6 kg)       Assessment & Plan:   Problem List Items Addressed This Visit    Anxiety and depression    Start sertraline.       Relevant Medications   sertraline (ZOLOFT) 50 MG tablet   RESOLVED: Annual physical exam - Primary    Other Visit Diagnoses    Right leg paresthesias       Possibly meralgia paresthetica. Re-evalaute at follow-up visit, if persists.   Burning sensation of feet       Long hours standing. ?Plantar fasciitis. Good-fitting, high quality shoes recommended. Re-evaluate at follow-up if persists.   Relevant Orders   Ambulatory referral to Podiatry   Routine screening for STI (  sexually transmitted infection)       Relevant Orders   Hepatitis C antibody (Completed)   HIV antibody (Completed)   RPR (Completed)   GC/Chlamydia Probe Amp       Return in about 4 weeks (around 09/13/2017) for re-evalaution of mood.   Fernande Brashelle S. Mariadelaluz Guggenheim, PA-C Primary [facility] at [facility]

## 2017-08-16 NOTE — Patient Instructions (Addendum)
For therapy -- [facility] (Beth Kincaid, Ernest McCoy,  Kit[place], Karla Townsend) - [phone] [facility] (Julie Whitt, Terri Bauert) - [phone] [facility] - [phone] [facility] - [phone] Bob Mylan - [phone] Sarah Young - [facility], [phone] [facility]  - [phone] (do not file insurance)  When starting the sertraline (Zoloft), take 1/2 tablet daily for the first week, then increase to 1 tablet daily.  IF you received an x-ray today, you will receive an invoice from [facility]. Please contact  Radiology at [phone] with questions or concerns regarding your invoice.   IF you received labwork today, you will receive an invoice from [facility]. Please contact [facility] at [phone] with questions or concerns regarding your invoice.   Our billing staff will not be able to assist you with questions regarding bills from these companies.  You will be contacted with the lab results as soon as they are available. The fastest way to get your results is to activate your My Chart account. Instructions are located on the last page of this paperwork. If you have not heard from us regarding the results in 2 weeks, please contact this office.     Preventive Care 18-39 Years, Female Preventive care refers to lifestyle choices and visits with your health care provider that can promote health and wellness. What does preventive care include?  A yearly physical exam. This is also called an annual well check.  Dental exams once or twice a year.  Routine eye exams. Ask your health care provider how often you should have your eyes checked.  Personal lifestyle choices, including: ? Daily care of your teeth and gums. ? Regular physical activity. ? Eating a healthy diet. ? Avoiding tobacco and drug use. ? Limiting  alcohol use. ? Practicing safe sex. ? Taking vitamin and mineral supplements as recommended by your health care provider. What happens during an annual well check? The services and screenings done by your health care provider during your annual well check will depend on your age, overall health, lifestyle risk factors, and family history of disease. Counseling Your health care provider may ask you questions about your:  Alcohol use.  Tobacco use.  Drug use.  Emotional well-being.  Home and relationship well-being.  Sexual activity.  Eating habits.  Work and work environment.  Method of birth control.  Menstrual cycle.  Pregnancy history.  Screening You may have the following tests or measurements:  Height, weight, and BMI.  Diabetes screening. This is done by checking your blood sugar (glucose) after you have not eaten for a while (fasting).  Blood pressure.  Lipid and cholesterol levels. These may be checked every 5 years starting at age 20.  Skin check.  Hepatitis C blood test.  Hepatitis B blood test.  Sexually transmitted disease (STD) testing.  BRCA-related cancer screening. This may be done if you have a family history of breast, ovarian, tubal, or peritoneal cancers.  Pelvic exam and Pap test. This may be done every 3 years starting at age 21. Starting at age 30, this may be done every 5 years if you have a Pap test in combination with an HPV test.  Discuss your test results, treatment options, and if necessary, the need for more tests with your health care provider. Vaccines Your health care provider may recommend certain vaccines, such as:  Influenza vaccine. This is recommended every year.  Tetanus, diphtheria, and acellular pertussis (  Tdap, Td) vaccine. You may need a Td booster every 10 years.  Varicella vaccine. You may need this if you have not been vaccinated.  HPV vaccine. If you are 60 or younger, you may need three doses over 6  months.  Measles, mumps, and rubella (MMR) vaccine. You may need at least one dose of MMR. You may also need a second dose.  Pneumococcal 13-valent conjugate (PCV13) vaccine. You may need this if you have certain conditions and were not previously vaccinated.  Pneumococcal polysaccharide (PPSV23) vaccine. You may need one or two doses if you smoke cigarettes or if you have certain conditions.  Meningococcal vaccine. One dose is recommended if you are age 67-21 years and a first-year college student living in a residence hall, or if you have one of several medical conditions. You may also need additional booster doses.  Hepatitis A vaccine. You may need this if you have certain conditions or if you travel or work in places where you may be exposed to hepatitis A.  Hepatitis B vaccine. You may need this if you have certain conditions or if you travel or work in places where you may be exposed to hepatitis B.  Haemophilus influenzae type b (Hib) vaccine. You may need this if you have certain risk factors.  Talk to your health care provider about which screenings and vaccines you need and how often you need them. This information is not intended to replace advice given to you by your health care provider. Make sure you discuss any questions you have with your health care provider. Document Released: 09/21/2001 Document Revised: 04/14/2016 Document Reviewed: 05/27/2015 Elsevier Interactive Patient Education  [employer].

## 2017-08-17 LAB — RPR: RPR Ser Ql: NONREACTIVE

## 2017-08-17 LAB — HEPATITIS C ANTIBODY

## 2017-08-17 LAB — HIV ANTIBODY (ROUTINE TESTING W REFLEX): HIV SCREEN 4TH GENERATION: NONREACTIVE

## 2017-08-25 LAB — WET PREP, GENITAL
Clue Cell Exam: NEGATIVE
Trichomonas Exam: NEGATIVE
Yeast Exam: NEGATIVE

## 2017-08-25 LAB — TRICHOMONAS CULTURE

## 2017-08-25 LAB — GC/CHLAMYDIA PROBE AMP

## 2017-08-25 LAB — SPECIMEN STATUS REPORT

## 2017-08-29 ENCOUNTER — Encounter: Payer: Self-pay | Admitting: Physician Assistant

## 2017-08-29 ENCOUNTER — Ambulatory Visit: Payer: No Typology Code available for payment source | Admitting: Podiatry

## 2017-08-29 ENCOUNTER — Other Ambulatory Visit: Payer: Self-pay | Admitting: Podiatry

## 2017-08-29 ENCOUNTER — Encounter: Payer: Self-pay | Admitting: Podiatry

## 2017-08-29 ENCOUNTER — Ambulatory Visit (INDEPENDENT_AMBULATORY_CARE_PROVIDER_SITE_OTHER): Payer: No Typology Code available for payment source

## 2017-08-29 VITALS — BP 133/76 | HR 60

## 2017-08-29 DIAGNOSIS — M7751 Other enthesopathy of right foot: Secondary | ICD-10-CM | POA: Diagnosis not present

## 2017-08-29 DIAGNOSIS — M7752 Other enthesopathy of left foot: Secondary | ICD-10-CM

## 2017-08-29 DIAGNOSIS — F329 Major depressive disorder, single episode, unspecified: Secondary | ICD-10-CM | POA: Insufficient documentation

## 2017-08-29 DIAGNOSIS — F419 Anxiety disorder, unspecified: Secondary | ICD-10-CM

## 2017-08-29 DIAGNOSIS — M779 Enthesopathy, unspecified: Principal | ICD-10-CM

## 2017-08-29 DIAGNOSIS — R52 Pain, unspecified: Secondary | ICD-10-CM

## 2017-08-29 DIAGNOSIS — M722 Plantar fascial fibromatosis: Secondary | ICD-10-CM | POA: Diagnosis not present

## 2017-08-29 DIAGNOSIS — M778 Other enthesopathies, not elsewhere classified: Secondary | ICD-10-CM

## 2017-08-29 DIAGNOSIS — F32A Depression, unspecified: Secondary | ICD-10-CM | POA: Insufficient documentation

## 2017-08-29 MED ORDER — METHYLPREDNISOLONE 4 MG PO TBPK
ORAL_TABLET | ORAL | 0 refills | Status: DC
Start: 2017-08-29 — End: 2017-09-20

## 2017-08-29 MED FILL — METHYLPREDNISOLONE 4 MG TAB: 4 | 6 days supply | Qty: 21 | Fill #0

## 2017-08-29 NOTE — Assessment & Plan Note (Signed)
-   Start sertraline 

## 2017-08-29 NOTE — Progress Notes (Signed)
   Subjective:    Patient ID: Kathleen Lambert, female    DOB: 08/28/1985, 32 y.o.   MRN: [id number]  HPI    Review of Systems  All other systems reviewed and are negative.      Objective:   Physical Exam        Assessment & Plan:

## 2017-08-31 ENCOUNTER — Ambulatory Visit: Payer: No Typology Code available for payment source | Admitting: Clinical

## 2017-08-31 DIAGNOSIS — F331 Major depressive disorder, recurrent, moderate: Secondary | ICD-10-CM

## 2017-08-31 NOTE — Progress Notes (Signed)
   Subjective: 32 year old female presents today as a new patient with a chief complaint of burning pain and tenderness to the plantar aspect of the bilateral feet that began 3 weeks ago. Activity on her feet increases the pain. She has tried taking Tylenol, massaging and applying heat therapy with no significant relief. Patient presents today for further treatment and evaluation.   Past Medical History:  Diagnosis Date  . Allergy   . Frequent headaches      Objective: Physical Exam General: The patient is alert and oriented x3 in no acute distress.  Dermatology: Skin is warm, dry and supple bilateral lower extremities. Negative for open lesions or macerations bilateral.   Vascular: Dorsalis Pedis and Posterior Tibial pulses palpable bilateral.  Capillary fill time is immediate to all digits.  Neurological: Epicritic and protective threshold intact bilateral.   Musculoskeletal: Tenderness to palpation at the medial calcaneal tubercale and through the insertion of the plantar fascia of the right foot. All other joints range of motion within normal limits bilateral. Strength 5/5 in all groups bilateral.   Radiographic exam: Normal osseous mineralization. Joint spaces preserved. No fracture/dislocation/boney destruction. Calcaneal spur present with mild thickening of plantar fascia right. No other soft tissue abnormalities or radiopaque foreign bodies.   Assessment: 1. Plantar fasciitis right 2. 1st MPJ capsulitis right  Plan of Care:  1. Patient evaluated. Xrays reviewed.   2. Injection of 0.5cc Celestone soluspan injected into first MPJ of the right foot. 3. Rx for Medrol Dose pack placed 4. Continue wearing custom molded orthotics and Dansko shoes. 5. Continue taking Meloxicam as directed. 6. Return to clinic as needed.  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     Felecia ShellingBrent M. Olivya Sobol, DPM [facility]  Dr. Felecia ShellingBrent M. Zayda Angell, DPM    [address]                                         [place], [place] [id number]                Office [phone]  Fax [phone]

## 2017-09-07 ENCOUNTER — Ambulatory Visit: Payer: No Typology Code available for payment source | Admitting: Clinical

## 2017-09-07 DIAGNOSIS — F331 Major depressive disorder, recurrent, moderate: Secondary | ICD-10-CM

## 2017-09-16 ENCOUNTER — Ambulatory Visit: Payer: No Typology Code available for payment source | Admitting: Physician Assistant

## 2017-09-19 ENCOUNTER — Other Ambulatory Visit: Payer: Self-pay | Admitting: Podiatry

## 2017-09-20 ENCOUNTER — Ambulatory Visit (INDEPENDENT_AMBULATORY_CARE_PROVIDER_SITE_OTHER): Payer: No Typology Code available for payment source | Admitting: Physician Assistant

## 2017-09-20 ENCOUNTER — Other Ambulatory Visit: Payer: Self-pay

## 2017-09-20 ENCOUNTER — Encounter: Payer: Self-pay | Admitting: Physician Assistant

## 2017-09-20 DIAGNOSIS — F32A Depression, unspecified: Secondary | ICD-10-CM

## 2017-09-20 DIAGNOSIS — F329 Major depressive disorder, single episode, unspecified: Secondary | ICD-10-CM | POA: Diagnosis not present

## 2017-09-20 DIAGNOSIS — F419 Anxiety disorder, unspecified: Secondary | ICD-10-CM | POA: Diagnosis not present

## 2017-09-20 MED ORDER — SERTRALINE HCL 100 MG PO TABS: 100.0000 mg | ORAL_TABLET | Freq: Every day | ORAL | 3 refills | Status: DC

## 2017-09-20 NOTE — Progress Notes (Signed)
Patient ID: Kathleen GoldsAlicia [name], female    DOB: 12/25/1985, 32 y.o.   MRN: [id number]  PCP: Porfirio OarJeffery, Tex Conroy, PA-C  Chief Complaint  Patient presents with  . Depression    1 month follow-up, pt was started on a new medication at last OV on 1/8     Subjective:   Presents for evaluation of mood since starting sertraline last month.  Tolerating it well.. Less irritable and depressed. Still having some insomnia, which she attributes to her job. Notes decreased concentration.  She notes a numbness and sensation in the right leg that is present only when she sits crosslegged.  No associated pain or reduced range of motion.   Review of Systems As above.  [facility] 09/20/2017 08/16/2017 02/09/2016  Decreased Interest 1 1 0  Down, Depressed, Hopeless 1 1 0  PHQ - 2 Score 2 2 0  Altered sleeping 1 3 -  Tired, decreased energy 1 3 -  Change in appetite 1 2 -  Feeling bad or failure about yourself  1 1 -  Trouble concentrating 1 1 -  Moving slowly or fidgety/restless 0 0 -  Suicidal thoughts 0 1 -  PHQ-9 Score 7 13 -  Difficult doing work/chores - Somewhat difficult -     Patient Active Problem List   Diagnosis Date Noted  . Anxiety and depression 08/29/2017     Prior to Admission medications   Medication Sig Start Date End Date Taking? Authorizing Provider  sertraline (ZOLOFT) 50 MG tablet Take 1 tablet (50 mg total) by mouth daily. 08/16/17  YES Leotis ShamesJeffery, Jahnyla Parrillo, PA-C     Allergies  Allergen Reactions  . Augmentin [Amoxicillin-Pot Clavulanate] Nausea Only       Objective:  Physical Exam  Constitutional: She is oriented to person, place, and time. She appears well-developed and well-nourished. She is active and cooperative. No distress.  BP 116/72   Pulse 82   Temp 98 F (36.7 C) (Oral)   Resp 16   Ht 5' 8.7" (1.745 m)   Wt 258 lb (117 kg)   LMP 09/22/2016 (Approximate)   SpO2 96%   BMI 38.43 kg/m   HENT:  Head: Normocephalic and atraumatic.   Right Ear: Hearing normal.  Left Ear: Hearing normal.  Eyes: Conjunctivae are normal. No scleral icterus.  Neck: Normal range of motion. Neck supple. No thyromegaly present.  Cardiovascular: Normal rate, regular rhythm and normal heart sounds.  Pulses:      Radial pulses are 2+ on the right side, and 2+ on the left side.  Pulmonary/Chest: Effort normal and breath sounds normal.  Lymphadenopathy:       Head (right side): No tonsillar, no preauricular, no posterior auricular and no occipital adenopathy present.       Head (left side): No tonsillar, no preauricular, no posterior auricular and no occipital adenopathy present.    She has no cervical adenopathy.       Right: No supraclavicular adenopathy present.       Left: No supraclavicular adenopathy present.  Neurological: She is alert and oriented to person, place, and time. No sensory deficit.  Skin: Skin is warm, dry and intact. No rash noted. No cyanosis or erythema. Nails show no clubbing.  Psychiatric: She has a normal mood and affect. Her speech is normal and behavior is normal.           Assessment & Plan:   Problem List Items Addressed This Visit    Anxiety  and depression    Improved on sertraline 50 mg, but not adequately controlled.  Increase to 100 mg daily.      Relevant Medications   sertraline (ZOLOFT) 100 MG tablet       Return in about 4 weeks (around 10/18/2017) for re-evalaution of mood.   Fernande Bras, PA-C Primary Care at [facility]

## 2017-09-20 NOTE — Progress Notes (Signed)
Subjective:    Patient ID: Kathleen Lambert, female    DOB: September 15, 1985, 32 y.o.   MRN: [id number]  HPI:   Depression       The patient presents with depression.  This is a chronic problem.  The current episode started more than 1 month ago.   The onset quality is undetermined.   The problem occurs daily.  The problem has been gradually improving since onset.  Associated symptoms include decreased concentration and insomnia.  Associated symptoms include no hopelessness, not irritable, no appetite change and no suicidal ideas.     The symptoms are aggravated by work stress and family issues.  Compliance with treatment is good.  Previous treatment provided moderate relief.  Past medical history includes depression and mental health disorder.    Patient Active Problem List   Diagnosis Date Noted  . Anxiety and depression 08/29/2017   Allergies  Allergen Reactions  . Augmentin [Amoxicillin-Pot Clavulanate] Nausea Only   Prior to Admission medications   Medication Sig Start Date End Date Taking? Authorizing Provider  sertraline (ZOLOFT) 50 MG tablet Take 1 tablet (50 mg total) by mouth daily. 08/16/17  Yes Porfirio Oar, PA-C   Past Medical History:  Diagnosis Date  . Allergy   . Frequent headaches    Social History   Socioeconomic History  . Marital status: Divorced    Spouse name: Not on file  . Number of children: Not on file  . Years of education: Not on file  . Highest education level: Bachelor's degree (e.g., BA, AB, BS)  Social Needs  . Financial resource strain: Not on file  . Food insecurity - worry: Not on file  . Food insecurity - inability: Not on file  . Transportation needs - medical: Not on file  . Transportation needs - non-medical: Not on file  Occupational History  . Occupation: Charity fundraiser    Comment: ED (in [place])  Tobacco Use  . Smoking status: Never Smoker  . Smokeless tobacco: Never Used  Substance and Sexual Activity  . Alcohol use: Yes    Alcohol/week:  0.6 oz    Types: 1 Standard drinks or equivalent per week  . Drug use: No  . Sexual activity: Not Currently  Other Topics Concern  . Not on file  Social History Narrative   Divorced.   Lives alone with her cats, Katniss and Mogli.   Family History  Problem Relation Age of Onset  . Mental illness Mother        borderline personality  . Hypertension Father   . Hyperlipidemia Father   . Breast cancer Paternal Aunt   . Lung cancer Paternal Uncle   . Diabetes Maternal Grandmother   . Heart disease Maternal Grandmother   . Hypertension Maternal Grandmother   . Heart disease Maternal Grandfather   . Stroke Paternal Grandfather   . Heart disease Paternal Grandfather    Past Surgical History:  Procedure Laterality Date  . WISDOM TOOTH EXTRACTION      Review of Systems  Constitutional: Negative for appetite change.  Neurological: Positive for numbness (in right leg, does not decrease ROM. Numbness only present when sitting cross-legged.).  Psychiatric/Behavioral: Positive for decreased concentration and depression. Negative for suicidal ideas. The patient has insomnia.        Objective:   Physical Exam  Constitutional: She appears well-developed and well-nourished. She is not irritable. No distress.  HENT:  Head: Normocephalic.  Right Ear: External ear normal.  Left Ear: External ear  normal.  Nose: Nose normal.  Mouth/Throat: Oropharynx is clear and moist.  Eyes: Right eye exhibits no discharge. Left eye exhibits no discharge. No scleral icterus.  Neck: Normal range of motion. Neck supple.  Cardiovascular: Normal rate, regular rhythm and normal heart sounds.  Pulmonary/Chest: Effort normal and breath sounds normal.  Musculoskeletal: Normal range of motion.  Psychiatric: She has a normal mood and affect.       Assessment & Plan:  Patient is doing well on Sertraline for her depression, but requires a dose adjustment to see maximum benefit. She is feeling less sad and less  irritable, and has no feelings of helplessness or suicidal ideation. 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 After discussion, patient is recommended to increase the dose from 50 mg to 100 mg daily. Patient is scheduled to follow up in three months or before that time if a question or concern arises.

## 2017-09-20 NOTE — Patient Instructions (Signed)
     IF you received an x-ray today, you will receive an invoice from [facility]. Please contact [facility] at [phone] with questions or concerns regarding your invoice.   IF you received labwork today, you will receive an invoice from [facility]. Please contact [facility] at [phone] with questions or concerns regarding your invoice.   Our billing staff will not be able to assist you with questions regarding bills from these companies.  You will be contacted with the lab results as soon as they are available. The fastest way to get your results is to activate your My Chart account. Instructions are located on the last page of this paperwork. If you have not heard from us regarding the results in 2 weeks, please contact this office.     

## 2017-09-21 ENCOUNTER — Ambulatory Visit: Payer: No Typology Code available for payment source | Admitting: Clinical

## 2017-09-21 ENCOUNTER — Other Ambulatory Visit: Payer: Self-pay | Admitting: Podiatry

## 2017-09-21 DIAGNOSIS — F331 Major depressive disorder, recurrent, moderate: Secondary | ICD-10-CM | POA: Diagnosis not present

## 2017-10-05 NOTE — Assessment & Plan Note (Signed)
Improved on sertraline 50 mg, but not adequately controlled.  Increase to 100 mg daily.

## 2017-10-11 ENCOUNTER — Ambulatory Visit: Payer: No Typology Code available for payment source | Admitting: Clinical

## 2017-10-11 DIAGNOSIS — F331 Major depressive disorder, recurrent, moderate: Secondary | ICD-10-CM | POA: Diagnosis not present

## 2017-10-27 ENCOUNTER — Ambulatory Visit: Payer: No Typology Code available for payment source | Admitting: Clinical

## 2017-10-27 DIAGNOSIS — F331 Major depressive disorder, recurrent, moderate: Secondary | ICD-10-CM | POA: Diagnosis not present

## 2017-11-02 ENCOUNTER — Encounter: Payer: Self-pay | Admitting: Physician Assistant

## 2017-11-02 ENCOUNTER — Other Ambulatory Visit: Payer: Self-pay

## 2017-11-02 ENCOUNTER — Telehealth: Payer: Self-pay | Admitting: Podiatry

## 2017-11-02 ENCOUNTER — Ambulatory Visit (INDEPENDENT_AMBULATORY_CARE_PROVIDER_SITE_OTHER): Payer: No Typology Code available for payment source | Admitting: Physician Assistant

## 2017-11-02 VITALS — BP 106/68 | HR 98 | Temp 98.7°F | Resp 16 | Ht 68.7 in | Wt 252.6 lb

## 2017-11-02 DIAGNOSIS — F329 Major depressive disorder, single episode, unspecified: Secondary | ICD-10-CM

## 2017-11-02 DIAGNOSIS — R110 Nausea: Secondary | ICD-10-CM | POA: Diagnosis not present

## 2017-11-02 DIAGNOSIS — F32A Depression, unspecified: Secondary | ICD-10-CM

## 2017-11-02 DIAGNOSIS — F419 Anxiety disorder, unspecified: Secondary | ICD-10-CM

## 2017-11-02 MED ORDER — ONDANSETRON 4 MG PO TBDP: 4.0000 mg | ORAL_TABLET | Freq: Three times a day (TID) | ORAL | 0 refills | Status: DC | PRN

## 2017-11-02 MED ORDER — BUPROPION HCL ER (XL) 150 MG PO TB24: 150.0000 mg | ORAL_TABLET | Freq: Every day | ORAL | 3 refills | Status: DC

## 2017-11-02 MED FILL — buPROPion HCL ER (XL) 150 M: 150 | 90 days supply | Qty: 90 | Fill #0

## 2017-11-02 MED FILL — ONDANSETRON ODT 4 MG TABLET: 4 | 3 days supply | Qty: 20 | Fill #0

## 2017-11-02 MED FILL — MELOXICAM 15 MG TABLET: 15 | 60 days supply | Qty: 60 | Fill #0

## 2017-11-02 MED FILL — SERTRALINE HCL 100 MG TAB: 100 | 90 days supply | Qty: 90 | Fill #0

## 2017-11-02 NOTE — Assessment & Plan Note (Signed)
Continue sertraline 50 mg daily. ADD bupropion XL 150 mg daily. Consider hormonal contraception to eliminate periods, due to pre-menstrual irritability and anxiety.

## 2017-11-02 NOTE — Patient Instructions (Signed)
     IF you received an x-ray today, you will receive an invoice from [facility]. Please contact [facility] at [phone] with questions or concerns regarding your invoice.   IF you received labwork today, you will receive an invoice from [facility]. Please contact [facility] at [phone] with questions or concerns regarding your invoice.   Our billing staff will not be able to assist you with questions regarding bills from these companies.  You will be contacted with the lab results as soon as they are available. The fastest way to get your results is to activate your My Chart account. Instructions are located on the last page of this paperwork. If you have not heard from us regarding the results in 2 weeks, please contact this office.     

## 2017-11-02 NOTE — Telephone Encounter (Signed)
Entered in error

## 2017-11-02 NOTE — Progress Notes (Signed)
Patient ID: Kathleen Lambert, female    DOB: 1985/12/18, 32 y.o.   MRN: [id number]  PCP: Porfirio Oar, PA-C  No chief complaint on file.   Subjective:   Presents for evaluation of anxiety and depression.  At her visit 6 weeks ago, we INCREASED sertraline to 100 mg. Felt tired, couldn't get out of bed, increased crying x 2 weeks. She appropriately reduced the dose back to 50 mg.  Feels OK. A little under the weather currently. 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 Able to drink liquids and eat.  Has noted that the week prior to onset of menstruation is terrible. Once she starts bleeding, her mood improves. Irritability and anxiety.    Review of Systems  Constitutional: Negative.   HENT: Negative for sore throat.   Eyes: Negative for visual disturbance.  Respiratory: Negative for cough, chest tightness, shortness of breath and wheezing.   Cardiovascular: Negative for chest pain and palpitations.  Gastrointestinal: Positive for nausea. Negative for abdominal pain, diarrhea and vomiting.  Genitourinary: Negative for dysuria, frequency, hematuria and urgency.  Musculoskeletal: Negative for arthralgias and myalgias.  Skin: Negative for rash.  Neurological: Negative for dizziness, weakness and headaches.  Psychiatric/Behavioral: Positive for dysphoric mood and suicidal ideas (no intent or plan). Negative for decreased concentration and self-injury. The patient is nervous/anxious.    Depression screen [facility] 2/9 11/02/2017 09/20/2017 08/16/2017 02/09/2016  Decreased Interest 1 1 1  0  Down, Depressed, Hopeless 2 1 1  0  PHQ - 2 Score 3 2 2  0  Altered sleeping 3 1 3  -  Tired, decreased energy 1 1 3  -  Change in appetite 2 1 2  -  Feeling bad or failure about yourself  2 1 1  -  Trouble concentrating 2 1 1  -  Moving slowly or fidgety/restless 0 0 0 -  Suicidal thoughts 1 0 1 -  PHQ-9 Score 14 7 13  -  Difficult doing work/chores Somewhat  difficult - Somewhat difficult -       Patient Active Problem List   Diagnosis Date Noted  . Anxiety and depression 08/29/2017     Prior to Admission medications   Medication Sig Start Date End Date Taking? Authorizing Provider  sertraline (ZOLOFT) 100 MG tablet Take 1 tablet (100 mg total) by mouth daily. Taking 50 mg daily. 09/20/17  Yes Porfirio Oar, PA-C     Allergies  Allergen Reactions  . Augmentin [Amoxicillin-Pot Clavulanate] Nausea Only       Objective:  Physical Exam  Constitutional: She is oriented to person, place, and time. She appears well-developed and well-nourished. She is active and cooperative. No distress.  BP 106/68   Pulse 98   Temp 98.7 F (37.1 C)   Resp 16   Ht 5' 8.7" (1.745 m)   Wt 252 lb 9.6 oz (114.6 kg)   SpO2 97%   BMI 37.63 kg/m    Eyes: Conjunctivae are normal.  Pulmonary/Chest: Effort normal.  Neurological: She is alert and oriented to person, place, and time.  Psychiatric: She has a normal mood and affect. Her speech is normal and behavior is normal.   Wt Readings from Last 3 Encounters:  11/02/17 252 lb 9.6 oz (114.6 kg)  09/20/17 258 lb (117 kg)  08/16/17 257 lb 3.2 oz (116.7 kg)       Assessment & Plan:   Problem List Items Addressed This Visit    Anxiety and depression - Primary  Continue sertraline 50 mg daily. ADD bupropion XL 150 mg daily. Consider hormonal contraception to eliminate periods, due to pre-menstrual irritability and anxiety.      Relevant Medications   buPROPion (WELLBUTRIN XL) 150 MG 24 hr tablet    Other Visit Diagnoses    Nausea without vomiting       Relevant Medications   ondansetron (ZOFRAN-ODT) 4 MG disintegrating tablet       Return in about 1 month (around 11/30/2017) for re-evalaution of mood.   Fernande Brashelle S. Lindalou Soltis, PA-C [facility] at [facility]

## 2017-11-10 ENCOUNTER — Encounter: Payer: Self-pay | Admitting: Physician Assistant

## 2017-11-17 ENCOUNTER — Ambulatory Visit: Payer: No Typology Code available for payment source | Admitting: Clinical

## 2017-11-17 DIAGNOSIS — F331 Major depressive disorder, recurrent, moderate: Secondary | ICD-10-CM

## 2017-11-29 ENCOUNTER — Ambulatory Visit (INDEPENDENT_AMBULATORY_CARE_PROVIDER_SITE_OTHER): Payer: No Typology Code available for payment source | Admitting: Physician Assistant

## 2017-11-29 ENCOUNTER — Encounter: Payer: Self-pay | Admitting: Physician Assistant

## 2017-11-29 ENCOUNTER — Other Ambulatory Visit: Payer: Self-pay

## 2017-11-29 VITALS — BP 110/78 | HR 90 | Temp 97.9°F | Resp 16 | Ht 68.7 in | Wt 254.0 lb

## 2017-11-29 DIAGNOSIS — L74519 Primary focal hyperhidrosis, unspecified: Secondary | ICD-10-CM

## 2017-11-29 DIAGNOSIS — Z3009 Encounter for other general counseling and advice on contraception: Secondary | ICD-10-CM | POA: Diagnosis not present

## 2017-11-29 DIAGNOSIS — F329 Major depressive disorder, single episode, unspecified: Secondary | ICD-10-CM | POA: Diagnosis not present

## 2017-11-29 DIAGNOSIS — F32A Depression, unspecified: Secondary | ICD-10-CM

## 2017-11-29 DIAGNOSIS — F419 Anxiety disorder, unspecified: Secondary | ICD-10-CM

## 2017-11-29 MED ORDER — ALUMINUM CHLORIDE 20 % EX SOLN: Freq: Every day | CUTANEOUS | 99 refills | Status: DC

## 2017-11-29 NOTE — Assessment & Plan Note (Signed)
Improved. Proceed with Nexplanon, in hopes of eliminating cyclic anxiety and emotional lability.  Consider d/c sertraline once medroxyprogesterone on board.

## 2017-11-29 NOTE — Progress Notes (Signed)
Patient ID: Kathleen GoldsAlicia [name], female    DOB: 07/02/1986, 32 y.o.   MRN: [id number]  PCP: Kathleen Lambert, Kathleen Boule, Lambert  Chief Complaint  Patient presents with  . Anxiety    follow up  . Depression    follow up     Subjective:   Presents for evaluation of anxiety and depression.  At her last visit, we added bupropion XL, 150 mg daily, to sertraline 50 mg. Recall that she did not tolerate sertraline at the 100 mg dose. After 2 weeks on the bupropion, she noticed a significant improvement in her mood, and feels good at the current doses of the two medications.  Continues to experience increased anxiety and emotional lability preceding the onset of her menstrual cycle each month. Once she starts her period, the symptoms resolve. We previously discussed hormonal contraception as a way to mitigate the cyclic exacerbation of her symptoms and she is now interested in Nexplanon.   Review of Systems As above. In addition, she reports excessive sweating. Thinks that it may be worse with the addition of bupropion, but likes the benefit to her mood enough that she doesn't want to stop it, and doesn't really want to reduce the sertraline, either. However, she sweating is problematic in that she sweats through her pants, giving the appearance that she has urinated on herself. She aas tried pads, layered underwear without benefit.  [facility] 11/29/2017 11/02/2017 09/20/2017 08/16/2017 02/09/2016  Decreased Interest 0 1 1 1  0  Down, Depressed, Hopeless 1 2 1 1  0  PHQ - 2 Score 1 3 2 2  0  Altered sleeping 0 3 1 3  -  Tired, decreased energy 1 1 1 3  -  Change in appetite 0 2 1 2  -  Feeling bad or failure about yourself  1 2 1 1  -  Trouble concentrating 0 2 1 1  -  Moving slowly or fidgety/restless 0 0 0 0 -  Suicidal thoughts - 1 0 1 -  PHQ-9 Score 3 14 7 13  -  Difficult doing work/chores Not difficult at all Somewhat difficult - Somewhat difficult -     Patient Active Problem List   Diagnosis Date Noted  . Anxiety and depression 08/29/2017     Prior to Admission medications   Medication Sig Start Date End Date Taking? Authorizing Provider  buPROPion (WELLBUTRIN XL) 150 MG 24 hr tablet Take 1 tablet (150 mg total) by mouth daily. 11/02/17   Kathleen Lambert, Kathleen Lambert  ondansetron (ZOFRAN-ODT) 4 MG disintegrating tablet Take 1-2 tablets (4-8 mg total) by mouth every 8 (eight) hours as needed for nausea or vomiting. 11/02/17   Kathleen Lambert, Kathleen Zwiebel, Lambert  sertraline (ZOLOFT) 100 MG tablet Take 1 tablet (100 mg total) by mouth daily. 09/20/17   Kathleen Lambert, Kathleen Wickens, Lambert     Allergies  Allergen Reactions  . Augmentin [Amoxicillin-Pot Clavulanate] Nausea Only       Objective:  Physical Exam  Constitutional: She is oriented to person, place, and time. She appears well-developed and well-nourished. She is active and cooperative. No distress.  BP 110/78   Pulse 90   Temp 97.9 F (36.6 C)   Resp 16   Ht 5' 8.7" (1.745 m)   Wt 254 lb (115.2 kg)   SpO2 97%   BMI 37.84 kg/m    Eyes: Conjunctivae are normal.  Pulmonary/Chest: Effort normal.  Neurological: She is alert and oriented to person, place, and time.  Psychiatric: She has a normal mood and affect. Her  speech is normal and behavior is normal.   Wt Readings from Last 3 Encounters:  11/29/17 254 lb (115.2 kg)  11/02/17 252 lb 9.6 oz (114.6 kg)  09/20/17 258 lb (117 kg)      Assessment & Plan:   Problem List Items Addressed This Visit    Anxiety and depression - Primary    Improved. Proceed with Nexplanon, in hopes of eliminating cyclic anxiety and emotional lability.  Consider d/c sertraline once medroxyprogesterone on board.      Excessive sweating, local    Trial of Drysol spray. Continue tapering off the sertraline, may need to stop bupropion.      Relevant Medications   aluminum chloride (DRYSOL) 20 % external solution    Other Visit Diagnoses    Encounter for other general counseling or advice on  contraception       desires Nexplanon. Initiate Prior authorization for device, then schedule during next menstrual bleed.       Return in about 3 months (around 02/28/2018) for re-evaluation of mood.   Kathleen Lambert, Lambert Primary Care at [facility]

## 2017-11-29 NOTE — Assessment & Plan Note (Signed)
Trial of Drysol spray. Continue tapering off the sertraline, may need to stop bupropion.

## 2017-11-29 NOTE — Patient Instructions (Signed)
     IF you received an x-ray today, you will receive an invoice from [facility]. Please contact [facility] at [phone] with questions or concerns regarding your invoice.   IF you received labwork today, you will receive an invoice from [facility]. Please contact [facility] at [phone] with questions or concerns regarding your invoice.   Our billing staff will not be able to assist you with questions regarding bills from these companies.  You will be contacted with the lab results as soon as they are available. The fastest way to get your results is to activate your My Chart account. Instructions are located on the last page of this paperwork. If you have not heard from us regarding the results in 2 weeks, please contact this office.     

## 2017-11-29 NOTE — Progress Notes (Signed)
Subjective:    Patient ID: Kathleen Lambert, female    DOB: 1985-08-20, 32 y.o.   MRN: [id number] Chief Complaint  Patient presents with  . Anxiety    follow up  . Depression    follow up     HPI  32 yo female with anxiety and depression presents for a follow up. Last visit 11/02/2017 she was using sertraline 50mg  and buproprion XL 150mg  qDay was added.  Bupropion working well per pt. After 2 weeks, she began noticing a change in her mood. Denies any side effects.  Period symptoms significantly worsens her anxiety and cause crying spells still. Symptoms last about 1-2 days prior to onset of menses. Symptoms resolve once she begins her menses.  Denies suicidal thoughts or thoughts of hurting self/others.  Review of Systems  Psychiatric/Behavioral: Negative for agitation, dysphoric mood, self-injury, sleep disturbance and suicidal ideas. The patient is not nervous/anxious.      Patient Active Problem List   Diagnosis Date Noted  . Anxiety and depression 08/29/2017    Past Medical History:  Diagnosis Date  . Allergy   . Frequent headaches     Prior to Admission medications   Medication Sig Start Date End Date Taking? Authorizing Provider  buPROPion (WELLBUTRIN XL) 150 MG 24 hr tablet Take 1 tablet (150 mg total) by mouth daily. 11/02/17   Porfirio Oar, PA-C  ondansetron (ZOFRAN-ODT) 4 MG disintegrating tablet Take 1-2 tablets (4-8 mg total) by mouth every 8 (eight) hours as needed for nausea or vomiting. 11/02/17   Porfirio Oar, PA-C  sertraline (ZOLOFT) 100 MG tablet Take 1 tablet (100 mg total) by mouth daily. 09/20/17   Porfirio Oar, PA-C    Allergies  Allergen Reactions  . Augmentin [Amoxicillin-Pot Clavulanate] Nausea Only   Depression screen [facility] 2/9 11/29/2017 11/02/2017 09/20/2017 08/16/2017 02/09/2016  Decreased Interest 0 1 1 1  0  Down, Depressed, Hopeless 1 2 1 1  0  PHQ - 2 Score 1 3 2 2  0  Altered sleeping 0 3 1 3  -  Tired, decreased energy 1 1 1 3  -   Change in appetite 0 2 1 2  -  Feeling bad or failure about yourself  1 2 1 1  -  Trouble concentrating 0 2 1 1  -  Moving slowly or fidgety/restless 0 0 0 0 -  Suicidal thoughts - 1 0 1 -  PHQ-9 Score 3 14 7 13  -  Difficult doing work/chores Not difficult at all Somewhat difficult - Somewhat difficult -        Objective:   Physical Exam  Constitutional: She is oriented to person, place, and time. She appears well-developed and well-nourished.  BP 110/78   Pulse 90   Temp 97.9 F (36.6 C)   Resp 16   Ht 5' 8.7" (1.745 m)   Wt 254 lb (115.2 kg)   SpO2 97%   BMI 37.84 kg/m    HENT:  Head: Normocephalic and atraumatic.  Eyes: Pupils are equal, round, and reactive to light. Conjunctivae and EOM are normal. Right eye exhibits no discharge. Left eye exhibits no discharge.  Neck: Normal range of motion. Neck supple. No tracheal deviation present.  Cardiovascular: Normal rate, regular rhythm, normal heart sounds and intact distal pulses. Exam reveals no gallop and no friction rub.  No murmur heard. Pulmonary/Chest: Effort normal and breath sounds normal. No respiratory distress. She has no wheezes. She exhibits no tenderness.  Musculoskeletal: Normal range of motion. She exhibits no edema.  Lymphadenopathy:  She has no cervical adenopathy.  Neurological: She is alert and oriented to person, place, and time.  Skin: Skin is warm and dry. No rash noted. No erythema.  Psychiatric: She has a normal mood and affect. Her behavior is normal.      Assessment & Plan:  1. Anxiety and depression Controlled, Continue sertraline 50mg  and bupropion xl 150mg . At next visit, if patients symptoms worsen, increase bupropion to 300mg . Avoid increasing sertraline, previous increase caused worsening symptoms of depression.

## 2017-11-30 ENCOUNTER — Ambulatory Visit: Payer: No Typology Code available for payment source | Admitting: Physician Assistant

## 2017-11-30 ENCOUNTER — Telehealth: Payer: Self-pay | Admitting: Physician Assistant

## 2017-11-30 NOTE — Telephone Encounter (Signed)
Incoming fax with message from [facility] regarding Aluminum Chloride (Drysol) 20% external solution:   "Drysol 20% is on backorder and all outpatient pharmacies are out of stock. Closest product would be 12% (CertainDri). OK to dispense?"  Provider, please advise.

## 2017-11-30 NOTE — Telephone Encounter (Signed)
Yes, ok to dispense alternative: Certain Dri

## 2017-12-02 NOTE — Telephone Encounter (Signed)
Phone call to pharmacy, spoke with Aundra MilletMegan, pharmacist. Message relayed from provider, she is appreciative.   Phone call to patient. Per signed authorization to leave detailed message, left voicemail stating Certain Dri called in as substitution to Drysol, please call back if any questions.

## 2017-12-22 ENCOUNTER — Ambulatory Visit: Payer: No Typology Code available for payment source | Admitting: Clinical

## 2017-12-29 ENCOUNTER — Ambulatory Visit: Payer: No Typology Code available for payment source | Admitting: Clinical

## 2018-01-03 ENCOUNTER — Ambulatory Visit: Payer: No Typology Code available for payment source | Admitting: Clinical

## 2018-01-03 DIAGNOSIS — F331 Major depressive disorder, recurrent, moderate: Secondary | ICD-10-CM

## 2018-01-23 ENCOUNTER — Other Ambulatory Visit: Payer: Self-pay

## 2018-01-23 ENCOUNTER — Ambulatory Visit (INDEPENDENT_AMBULATORY_CARE_PROVIDER_SITE_OTHER): Payer: No Typology Code available for payment source | Admitting: Physician Assistant

## 2018-01-23 ENCOUNTER — Ambulatory Visit: Payer: No Typology Code available for payment source | Admitting: Clinical

## 2018-01-23 ENCOUNTER — Encounter: Payer: Self-pay | Admitting: Physician Assistant

## 2018-01-23 VITALS — BP 122/74 | HR 100 | Temp 99.1°F | Resp 20 | Ht 68.7 in | Wt 253.0 lb

## 2018-01-23 DIAGNOSIS — F32A Depression, unspecified: Secondary | ICD-10-CM

## 2018-01-23 DIAGNOSIS — F331 Major depressive disorder, recurrent, moderate: Secondary | ICD-10-CM

## 2018-01-23 DIAGNOSIS — F419 Anxiety disorder, unspecified: Secondary | ICD-10-CM | POA: Diagnosis not present

## 2018-01-23 DIAGNOSIS — F329 Major depressive disorder, single episode, unspecified: Secondary | ICD-10-CM

## 2018-01-23 MED ORDER — ESCITALOPRAM OXALATE 10 MG PO TABS: 10.0000 mg | ORAL_TABLET | Freq: Every day | ORAL | 0 refills | Status: DC

## 2018-01-23 MED FILL — BUPROPION HCL XL 150 MG TAB: 150 | 90 days supply | Qty: 90 | Fill #1

## 2018-01-23 MED FILL — ESCITALOPRAM 10 MG TABLET: 10 | 30 days supply | Qty: 30 | Fill #0

## 2018-01-23 NOTE — Progress Notes (Signed)
   Subjective:    Patient ID: Kathleen Lambert, female    DOB: 08/16/1985, 32 y.o.   MRN: [id number]  Kathleen Lambert is a 32 year old female presenting for transfer of care. She notes she was told about gene site testing with Kathleen Lambert. Was on sertraline and bupropion; however, sertraline was causing diaphoresis so stopped it about 3 weeks ago.  When she decreased that she noticed a worsening of her mood.  She feels like the Wellbutrin has helped her mood and not affected her anxiety negatively.  She is in therapy which she goes once a month and she finds that helpful.  Overall in regards to her anxiety and depression, she feels like her depression started first and then her  anxiety started afterwards.   Still is interested in getting a nexplanon for premenstrual anxiety/depression symptoms as discussed at previous appointment.  She has been on birth control in the past and did not like how this made her feel.   Also believes has sinusitis. Symptoms of congestion, sore throat, sinus pain, post nasal drip, productive cough, since Friday.      Review of Systems  Respiratory: Negative for chest tightness and shortness of breath.   Cardiovascular: Negative for chest pain and palpitations.  Gastrointestinal: Negative for constipation and diarrhea.  Psychiatric/Behavioral: Positive for agitation (More irritability associated with anxiety) and dysphoric mood. Negative for self-injury and suicidal ideas. The patient is nervous/anxious.        Objective:   Physical Exam  Constitutional: She is oriented to person, place, and time. She appears well-developed and well-nourished.  HENT:  Head: Normocephalic and atraumatic.  Right Ear: Hearing and external ear normal.  Left Ear: Hearing and external ear normal.  Eyes: Pupils are equal, round, and reactive to light. Conjunctivae are normal.  Neck: Normal range of motion. No thyromegaly present.  Cardiovascular: Normal rate, regular rhythm and normal heart  sounds. Exam reveals no gallop and no friction rub.  No murmur heard. Pulses:      Radial pulses are 2+ on the right side, and 2+ on the left side.  Pulmonary/Chest: Effort normal and breath sounds normal. No stridor. She has no wheezes. She has no rales.  Lymphadenopathy:    She has no cervical adenopathy.  Neurological: She is alert and oriented to person, place, and time.  Skin: Skin is warm and dry.  Psychiatric: Her speech is normal and behavior is normal. Judgment and thought content normal. She exhibits a depressed mood.  Vitals reviewed.     Assessment & Plan:  Anxiety and depression - Plan: escitalopram (LEXAPRO) 10 MG tablet discussed with patient GENOsight testing and its use in her care.  This may be helpful for her in the future but since she is only on 2 medications we will continue medication adjustment before we do this testing.  We will try Lexapro as she got a response to serotonin from the Zoloft but was unable to tolerate the side effects.  If she is also unable to tolerate the side effects of Lexapro will consider change to Pristiq.  May increase Wellbutrin in future.  Patient to continue therapy.  Kathleen LennertSarah Adilyn Humes PA-C  Primary Care at [facility] 01/24/2018 7:37 AM  I was directly involved with the patient's care and agree with the physical, diagnosis and treatment plan. Note was adjusted with my history and physical exam findings.

## 2018-01-23 NOTE — Patient Instructions (Signed)
     IF you received an x-ray today, you will receive an invoice from [facility]. Please contact [facility] at [phone] with questions or concerns regarding your invoice.   IF you received labwork today, you will receive an invoice from [facility]. Please contact [facility] at [phone] with questions or concerns regarding your invoice.   Our billing staff will not be able to assist you with questions regarding bills from these companies.  You will be contacted with the lab results as soon as they are available. The fastest way to get your results is to activate your My Chart account. Instructions are located on the last page of this paperwork. If you have not heard from us regarding the results in 2 weeks, please contact this office.     

## 2018-01-24 ENCOUNTER — Encounter: Payer: Self-pay | Admitting: Physician Assistant

## 2018-01-24 ENCOUNTER — Telehealth: Payer: Self-pay | Admitting: *Deleted

## 2018-01-27 NOTE — Telephone Encounter (Signed)
Copied from CRM [phone]#[id number]. Topic: Quick Communication - See Telephone Encounter >> Jan 27, 2018  3:08 PM Oneal GroutSebastian, Jennifer S wrote: CRM for notification. See Telephone encounter for: 01/27/18. Returning call, received call stating insurance was inactive for PA on birth control implant. [facility] [employer] Plan ID # [id number] Grp # [id number]

## 2018-01-28 NOTE — Telephone Encounter (Signed)
Pt message re: insurance inactive for [facility] Sent to Maralyn SagoSarah and Raynelle FanningJulie for clarification/action

## 2018-01-30 NOTE — Telephone Encounter (Signed)
Can we call patient and make sure we have the most recent insurance information please.

## 2018-02-02 ENCOUNTER — Ambulatory Visit (INDEPENDENT_AMBULATORY_CARE_PROVIDER_SITE_OTHER): Payer: No Typology Code available for payment source | Admitting: Physician Assistant

## 2018-02-02 ENCOUNTER — Telehealth: Payer: Self-pay

## 2018-02-02 ENCOUNTER — Other Ambulatory Visit: Payer: Self-pay

## 2018-02-02 VITALS — BP 102/70 | HR 92 | Temp 99.2°F | Resp 18 | Ht 68.7 in | Wt 251.4 lb

## 2018-02-02 DIAGNOSIS — F329 Major depressive disorder, single episode, unspecified: Secondary | ICD-10-CM

## 2018-02-02 DIAGNOSIS — N943 Premenstrual tension syndrome: Secondary | ICD-10-CM

## 2018-02-02 DIAGNOSIS — F32A Depression, unspecified: Secondary | ICD-10-CM

## 2018-02-02 DIAGNOSIS — F419 Anxiety disorder, unspecified: Secondary | ICD-10-CM | POA: Diagnosis not present

## 2018-02-02 DIAGNOSIS — Z30017 Encounter for initial prescription of implantable subdermal contraceptive: Secondary | ICD-10-CM | POA: Diagnosis not present

## 2018-02-02 MED ORDER — ETONOGESTREL 68 MG ~~LOC~~ IMPL
68.0000 mg | DRUG_IMPLANT | Freq: Once | SUBCUTANEOUS | Status: AC
  Administered 2018-02-02: 68 mg via SUBCUTANEOUS

## 2018-02-02 NOTE — Progress Notes (Signed)
Kathleen Lambert  MRN: [id number] DOB: 05-Nov-1985  PCP: Morrell Riddle, PA-C  Chief Complaint  Patient presents with  . nexplanon    Subjective:  Pt presents to clinic for nexplanon placement.  She is hoping that will help her anxiety and depression as OCP in the past have made it worse.  She has no questions about having the Nexplanon placed   History is obtained by patient.  Review of Systems  Constitutional: Negative for chills and fever.    Patient Active Problem List   Diagnosis Date Noted  . Excessive sweating, local 11/29/2017  . Anxiety and depression 08/29/2017    Current Outpatient Medications on File Prior to Visit  Medication Sig Dispense Refill  . aluminum chloride (DRYSOL) 20 % external solution Apply topically at bedtime. 60 mL PRN  . buPROPion (WELLBUTRIN XL) 150 MG 24 hr tablet Take 1 tablet (150 mg total) by mouth daily. 90 tablet 3  . escitalopram (LEXAPRO) 10 MG tablet Take 1 tablet (10 mg total) by mouth daily. 30 tablet 0  . meloxicam (MOBIC) 15 MG tablet Take 15 mg by mouth daily.  1  . ondansetron (ZOFRAN-ODT) 4 MG disintegrating tablet Take 1-2 tablets (4-8 mg total) by mouth every 8 (eight) hours as needed for nausea or vomiting. 20 tablet 0   No current facility-administered medications on file prior to visit.     Allergies  Allergen Reactions  . Augmentin [Amoxicillin-Pot Clavulanate] Nausea Only    Past Medical History:  Diagnosis Date  . Allergy   . Contraceptive management   . Frequent headaches    Social History   Social History Narrative   Divorced.   Lives alone with her cats, Katniss and Mogli.   Social History   Tobacco Use  . Smoking status: Never Smoker  . Smokeless tobacco: Never Used  Substance Use Topics  . Alcohol use: Yes    Alcohol/week: 0.6 oz    Types: 1 Standard drinks or equivalent per week    Comment: social rare  . Drug use: No   family history includes Breast cancer in her paternal aunt; Diabetes in  her maternal grandmother; Heart disease in her maternal grandfather, maternal grandmother, and paternal grandfather; Hyperlipidemia in her father; Hypertension in her father and maternal grandmother; Lung cancer in her paternal uncle; Mental illness in her mother; Stroke in her paternal grandfather.     Objective:  BP 102/70   Pulse 92   Temp 99.2 F (37.3 C) (Oral)   Resp 18   Ht 5' 8.7" (1.745 m)   Wt 251 lb 6.4 oz (114 kg)   LMP 01/01/2018   SpO2 97%   BMI 37.45 kg/m  Body mass index is 37.45 kg/m.  Wt Readings from Last 3 Encounters:  02/02/18 251 lb 6.4 oz (114 kg)  01/23/18 253 lb (114.8 kg)  11/29/17 254 lb (115.2 kg)    Physical Exam  Constitutional: She is oriented to person, place, and time. She appears well-developed and well-nourished.  HENT:  Head: Normocephalic and atraumatic.  Right Ear: Hearing and external ear normal.  Left Ear: Hearing and external ear normal.  Eyes: Conjunctivae are normal.  Neck: Normal range of motion.  Pulmonary/Chest: Effort normal.  Neurological: She is alert and oriented to person, place, and time.  Skin: Skin is warm, dry and intact.  Psychiatric: She has a normal mood and affect. Her behavior is normal. Judgment and thought content normal.  Vitals reviewed.  Nexplanon insertion procedure:  Risks vs benefits of implant discussed with patient .  Risks of the procedure discussed with the patient.  Consent obtained and consent formed signed and then scanned into her chart.  Pt was placed with her left arm in the correct position and the area was marked for the insertion.  ETOH swab used to clean the area and then local anesthesia with 2% lidocaine 3cc for adequate local anesthesia.  Betadine prep to the area.  Nexplanon placed with insertion device.  Both ends of Nexplanon palpable by me and patient.  Steri-strip placed at insertion site and pressure drsg placed around the arm.  Assessment and Plan :  Premenstrual tension  Nexplanon  insertion - Plan: etonogestrel (NEXPLANON) implant 68 mg  Anxiety and depression   Nexplanon placed - questions answered - wound care d/w pt  Patient verbalized to me that they understand the following: diagnosis, what is being done for them, what to expect and what should be done at home.  Their questions have been answered.  See after visit summary for patient specific instructions.  Benny LennertSarah Shailee Foots PA-C  Primary Care at [facility] 02/02/2018 3:54 PM  Please note: Portions of this report may have been transcribed using dragon voice recognition software. Every effort was made to ensure accuracy; however, inadvertent computerized transcription errors may be present.

## 2018-02-02 NOTE — Telephone Encounter (Signed)
PA form completed, sent electronically. Called [employer]  [phone] [id number] Spoke with Lelon MastSamantha - she states NO Prior Authorization is needed.  Form printed and scanned into pt chart.

## 2018-02-02 NOTE — Telephone Encounter (Signed)
Please call patient and verify insurance that we have in Epic - then I need a prior authorization for her nexplanon as she has an appt today at 1:20.

## 2018-02-02 NOTE — Patient Instructions (Signed)
     IF you received an x-ray today, you will receive an invoice from [facility]. Please contact  Radiology at [phone] with questions or concerns regarding your invoice.   IF you received labwork today, you will receive an invoice from [facility]. Please contact [facility] at [phone] with questions or concerns regarding your invoice.   Our billing staff will not be able to assist you with questions regarding bills from these companies.  You will be contacted with the lab results as soon as they are available. The fastest way to get your results is to activate your My Chart account. Instructions are located on the last page of this paperwork. If you have not heard from us regarding the results in 2 weeks, please contact this office.     

## 2018-02-22 ENCOUNTER — Ambulatory Visit: Payer: No Typology Code available for payment source | Admitting: Clinical

## 2018-02-22 DIAGNOSIS — F331 Major depressive disorder, recurrent, moderate: Secondary | ICD-10-CM | POA: Diagnosis not present

## 2018-02-23 ENCOUNTER — Ambulatory Visit: Payer: No Typology Code available for payment source | Admitting: Clinical

## 2018-02-27 ENCOUNTER — Other Ambulatory Visit: Payer: Self-pay | Admitting: Physician Assistant

## 2018-02-27 DIAGNOSIS — F419 Anxiety disorder, unspecified: Principal | ICD-10-CM

## 2018-02-27 DIAGNOSIS — F32A Depression, unspecified: Secondary | ICD-10-CM

## 2018-02-27 DIAGNOSIS — F329 Major depressive disorder, single episode, unspecified: Secondary | ICD-10-CM

## 2018-02-28 ENCOUNTER — Other Ambulatory Visit: Payer: Self-pay | Admitting: Physician Assistant

## 2018-02-28 DIAGNOSIS — F32A Depression, unspecified: Secondary | ICD-10-CM

## 2018-02-28 DIAGNOSIS — F419 Anxiety disorder, unspecified: Principal | ICD-10-CM

## 2018-02-28 DIAGNOSIS — F329 Major depressive disorder, single episode, unspecified: Secondary | ICD-10-CM

## 2018-03-01 MED FILL — ESCITALOPRAM 10 MG TABLET: 10 | 30 days supply | Qty: 30 | Fill #0

## 2018-03-01 NOTE — Telephone Encounter (Signed)
escitalopram refill Last Refill:01/23/18 # 30 Last OV: 01/23/18 PCP: Benny LennertSarah Weber PA Pharmacy:[facility]

## 2018-03-03 ENCOUNTER — Encounter: Payer: Self-pay | Admitting: Physician Assistant

## 2018-03-03 ENCOUNTER — Ambulatory Visit (INDEPENDENT_AMBULATORY_CARE_PROVIDER_SITE_OTHER): Payer: No Typology Code available for payment source | Admitting: Physician Assistant

## 2018-03-03 ENCOUNTER — Other Ambulatory Visit: Payer: Self-pay

## 2018-03-03 VITALS — BP 102/66 | HR 93 | Temp 99.0°F | Resp 18 | Ht 68.7 in | Wt 251.2 lb

## 2018-03-03 DIAGNOSIS — Z975 Presence of (intrauterine) contraceptive device: Secondary | ICD-10-CM

## 2018-03-03 DIAGNOSIS — F329 Major depressive disorder, single episode, unspecified: Secondary | ICD-10-CM | POA: Diagnosis not present

## 2018-03-03 DIAGNOSIS — F32A Depression, unspecified: Secondary | ICD-10-CM

## 2018-03-03 DIAGNOSIS — F419 Anxiety disorder, unspecified: Secondary | ICD-10-CM | POA: Diagnosis not present

## 2018-03-03 DIAGNOSIS — N926 Irregular menstruation, unspecified: Secondary | ICD-10-CM

## 2018-03-03 MED ORDER — ESCITALOPRAM OXALATE 10 MG PO TABS: 10.0000 mg | ORAL_TABLET | Freq: Every day | ORAL | 1 refills | Status: DC

## 2018-03-03 NOTE — Patient Instructions (Addendum)
For irregular bleeding on nexplanon -- Motrin 800mg  3x/day for 14 days  [facility] - [address], [place], [place] [address] Phone: [phone]   Barnett AbuWiseman or Leretha PolSantiago for follow-up care    IF you received an x-ray today, you will receive an invoice from [facility]. Please contact [facility] at [phone] with questions or concerns regarding your invoice.   IF you received labwork today, you will receive an invoice from [facility]. Please contact [facility] at [phone] with questions or concerns regarding your invoice.   Our billing staff will not be able to assist you with questions regarding bills from these companies.  You will be contacted with the lab results as soon as they are available. The fastest way to get your results is to activate your My Chart account. Instructions are located on the last page of this paperwork. If you have not heard from us regarding the results in 2 weeks, please contact this office.

## 2018-03-03 NOTE — Progress Notes (Signed)
Macario Goldslicia A Ostroff  MRN: [id number] DOB: 10/07/1985  PCP: Morrell RiddleWeber, Sarah L, PA-C  Chief Complaint  Patient presents with  . Anxiety    follow up     Subjective:  Pt presents to clinic for follow-up of her anxiety. Since starting on the Lexapro her anxiety is well controlled she is happy on her current medication regimen.  Since the placement of her Nexplanon she has been spotting until this week she has had a full menses.    History is obtained by patient.  Review of Systems  Constitutional: Negative for chills and fever.  Psychiatric/Behavioral: Positive for dysphoric mood (Proved with medications). The patient is nervous/anxious (Improved with medications).     Patient Active Problem List   Diagnosis Date Noted  . Excessive sweating, local 11/29/2017  . Anxiety and depression 08/29/2017    Current Outpatient Medications on File Prior to Visit  Medication Sig Dispense Refill  . aluminum chloride (DRYSOL) 20 % external solution Apply topically at bedtime. 60 mL PRN  . buPROPion (WELLBUTRIN XL) 150 MG 24 hr tablet Take 1 tablet (150 mg total) by mouth daily. 90 tablet 3  . meloxicam (MOBIC) 15 MG tablet Take 15 mg by mouth daily.  1  . ondansetron (ZOFRAN-ODT) 4 MG disintegrating tablet Take 1-2 tablets (4-8 mg total) by mouth every 8 (eight) hours as needed for nausea or vomiting. 20 tablet 0   No current facility-administered medications on file prior to visit.     Allergies  Allergen Reactions  . Augmentin [Amoxicillin-Pot Clavulanate] Nausea Only    Past Medical History:  Diagnosis Date  . Allergy   . Contraceptive management   . Frequent headaches    Social History   Social History Narrative   Divorced.   Lives alone with her cats, Katniss and Mogli.   Social History   Tobacco Use  . Smoking status: Never Smoker  . Smokeless tobacco: Never Used  Substance Use Topics  . Alcohol use: Yes    Alcohol/week: 0.6 oz    Types: 1 Standard drinks or equivalent  per week    Comment: social rare  . Drug use: No   family history includes Breast cancer in her paternal aunt; Diabetes in her maternal grandmother; Heart disease in her maternal grandfather, maternal grandmother, and paternal grandfather; Hyperlipidemia in her father; Hypertension in her father and maternal grandmother; Lung cancer in her paternal uncle; Mental illness in her mother; Stroke in her paternal grandfather.     Objective:  BP 102/66   Pulse 93   Temp 99 F (37.2 C) (Oral)   Resp 18   Ht 5' 8.7" (1.745 m)   Wt 251 lb 3.2 oz (113.9 kg)   LMP 02/15/2018   SpO2 97%   BMI 37.42 kg/m  Body mass index is 37.42 kg/m.  Wt Readings from Last 3 Encounters:  03/03/18 251 lb 3.2 oz (113.9 kg)  02/02/18 251 lb 6.4 oz (114 kg)  01/23/18 253 lb (114.8 kg)    Physical Exam  Constitutional: She is oriented to person, place, and time. She appears well-developed and well-nourished.  HENT:  Head: Normocephalic and atraumatic.  Right Ear: Hearing and external ear normal.  Left Ear: Hearing and external ear normal.  Eyes: Conjunctivae are normal.  Neck: Normal range of motion.  Pulmonary/Chest: Effort normal.  Neurological: She is alert and oriented to person, place, and time.  Skin: Skin is warm, dry and intact.  Psychiatric: She has a normal mood and affect.  Her behavior is normal. Judgment and thought content normal.  Vitals reviewed.   Assessment and Plan :  Anxiety and depression - Plan: escitalopram (LEXAPRO) 10 MG tablet -patient to continue Lexapro and Wellbutrin as this combination has really controlled her depression and anxiety well.  She was given 6 months of this medication but should return to clinic in 3 months for recheck since this is a new medication regimen over the last month.  Irregular menses -related likely to her Nexplanon it patient will use over-the-counter anti-inflammatories for up to 2 weeks to see if menses will stop.  If this does not work there is a  possibility of using doxycycline as well as perhaps a pack of birth control to stop the irregular bleeding.  She is still within the 3 months time.  That this is expected to be normal.  Nexplanon in place  Patient verbalized to me that they understand the following: diagnosis, what is being done for them, what to expect and what should be done at home.  Their questions have been answered.  See after visit summary for patient specific instructions.  Benny Lennert PA-C  Primary Care at [facility] 03/03/2018 12:21 PM  Please note: Portions of this report may have been transcribed using dragon voice recognition software. Every effort was made to ensure accuracy; however, inadvertent computerized transcription errors may be present.

## 2018-04-04 MED FILL — ESCITALOPRAM 10 MG TABLET: 10 | 90 days supply | Qty: 90 | Fill #0

## 2018-04-06 ENCOUNTER — Ambulatory Visit: Payer: No Typology Code available for payment source | Admitting: Clinical

## 2018-05-04 ENCOUNTER — Ambulatory Visit (INDEPENDENT_AMBULATORY_CARE_PROVIDER_SITE_OTHER): Payer: No Typology Code available for payment source | Admitting: Family Medicine

## 2018-05-04 ENCOUNTER — Other Ambulatory Visit: Payer: Self-pay

## 2018-05-04 ENCOUNTER — Ambulatory Visit: Payer: No Typology Code available for payment source | Admitting: Clinical

## 2018-05-04 ENCOUNTER — Encounter: Payer: Self-pay | Admitting: Family Medicine

## 2018-05-04 VITALS — BP 119/77 | HR 89 | Temp 98.1°F | Resp 17 | Ht 68.7 in | Wt 251.0 lb

## 2018-05-04 DIAGNOSIS — F419 Anxiety disorder, unspecified: Secondary | ICD-10-CM

## 2018-05-04 DIAGNOSIS — F331 Major depressive disorder, recurrent, moderate: Secondary | ICD-10-CM

## 2018-05-04 DIAGNOSIS — L74519 Primary focal hyperhidrosis, unspecified: Secondary | ICD-10-CM | POA: Diagnosis not present

## 2018-05-04 DIAGNOSIS — F329 Major depressive disorder, single episode, unspecified: Secondary | ICD-10-CM | POA: Diagnosis not present

## 2018-05-04 DIAGNOSIS — F32A Depression, unspecified: Secondary | ICD-10-CM

## 2018-05-04 MED ORDER — SCOPOLAMINE 1 MG/3DAYS TD PT72: 1.0000 | MEDICATED_PATCH | TRANSDERMAL | 12 refills | Status: DC

## 2018-05-04 MED FILL — SCOPOLAMINE 1 MG/3DAYS PT72: 1 | 30 days supply | Qty: 10 | Fill #0

## 2018-05-04 MED FILL — buPROPion HCL ER (XL) 150 M: 150 | 90 days supply | Qty: 90 | Fill #2

## 2018-05-04 NOTE — Progress Notes (Signed)
9/26/20192:17 PM  CATHLEEN YAGI Dec 25, 1985, 32 y.o. female [id number]  Chief Complaint  Patient presents with  . discuss medications and side effects    wellbutrin and lexapro    HPI:   Patient is a 32 y.o. female with past medical history significant for anxiety and depression who presents today to discuss medications  Previous patient of Benny Lennert Started on current regime several months Doing really well depression wise  Having severe diaphoresis with activity only started with sertraline, also happened when she was just on wellbutrin Changed to lexapro still same issues wellbutrin by itself did not work completely Needs to wear adult diapers due to severe dripping down her back   Fall Risk  05/04/2018 03/03/2018 02/02/2018 01/23/2018 11/29/2017  Falls in the past year? No No No No No     Depression screen [facility] 2/9 05/04/2018 03/03/2018 01/23/2018  Decreased Interest 0 0 1  Down, Depressed, Hopeless 0 0 1  PHQ - 2 Score 0 0 2  Altered sleeping - - 1  Tired, decreased energy - - 2  Change in appetite - - 1  Feeling bad or failure about yourself  - - 1  Trouble concentrating - - 0  Moving slowly or fidgety/restless - - 0  Suicidal thoughts - - 0  PHQ-9 Score - - 7  Difficult doing work/chores - - Somewhat difficult    Allergies  Allergen Reactions  . Augmentin [Amoxicillin-Pot Clavulanate] Nausea Only    Prior to Admission medications   Medication Sig Start Date End Date Taking? Authorizing Provider  aluminum chloride (DRYSOL) 20 % external solution Apply topically at bedtime. 11/29/17  Yes Jeffery, Chelle, PA  buPROPion (WELLBUTRIN XL) 150 MG 24 hr tablet Take 1 tablet (150 mg total) by mouth daily. 11/02/17  Yes Jeffery, Chelle, PA  escitalopram (LEXAPRO) 10 MG tablet Take 1 tablet (10 mg total) by mouth daily. 03/03/18  Yes Weber, Dema Severin, PA-C  meloxicam (MOBIC) 15 MG tablet Take 15 mg by mouth daily. 11/02/17  Yes [provider]  ondansetron  (ZOFRAN-ODT) 4 MG disintegrating tablet Take 1-2 tablets (4-8 mg total) by mouth every 8 (eight) hours as needed for nausea or vomiting. 11/02/17  Yes Porfirio Oar, PA    Past Medical History:  Diagnosis Date  . Allergy   . Contraceptive management   . Frequent headaches     Past Surgical History:  Procedure Laterality Date  . WISDOM TOOTH EXTRACTION      Social History   Tobacco Use  . Smoking status: Never Smoker  . Smokeless tobacco: Never Used  Substance Use Topics  . Alcohol use: Yes    Alcohol/week: 1.0 standard drinks    Types: 1 Standard drinks or equivalent per week    Comment: social rare    Family History  Problem Relation Age of Onset  . Mental illness Mother        borderline personality  . Hypertension Father   . Hyperlipidemia Father   . Breast cancer Paternal Aunt   . Lung cancer Paternal Uncle   . Diabetes Maternal Grandmother   . Heart disease Maternal Grandmother   . Hypertension Maternal Grandmother   . Heart disease Maternal Grandfather   . Stroke Paternal Grandfather   . Heart disease Paternal Grandfather     ROS Per hpi  OBJECTIVE:  Blood pressure 119/77, pulse 89, temperature 98.1 F (36.7 C), temperature source Oral, resp. rate 17, height 5' 8.7" (1.745 m), weight 251 lb (113.9 kg),  last menstrual period 04/27/2018, SpO2 96 %. Body mass index is 37.39 kg/m.   Physical Exam  Constitutional: She is oriented to person, place, and time. She appears well-developed and well-nourished.  HENT:  Head: Normocephalic and atraumatic.  Mouth/Throat: Oropharynx is clear and moist. No oropharyngeal exudate.  Eyes: Pupils are equal, round, and reactive to light. Conjunctivae and EOM are normal. No scleral icterus.  Neck: Neck supple.  Cardiovascular: Normal rate, regular rhythm and normal heart sounds. Exam reveals no gallop and no friction rub.  No murmur heard. Pulmonary/Chest: Effort normal and breath sounds normal. She has no wheezes. She  has no rales.  Musculoskeletal: She exhibits no edema.  Neurological: She is alert and oriented to person, place, and time.  Skin: Skin is warm and dry.  Psychiatric: She has a normal mood and affect.  Nursing note and vitals reviewed.    ASSESSMENT and PLAN  1. Anxiety and depression 2. Excessive sweating, local Thought to be 2/2 current meds, both know to cause diaphoresis. She is happy with mood results. drysol not working. Will try scopolamine patch. Discussed weaning off wellbutrin and she has never been on lexapro alone, does not know if enough to control mood and if diaphoresis would be less, consider another augmenting medication or switching to SNRI  Other orders - scopolamine (TRANSDERM-SCOP) 1 MG/3DAYS; Place 1 patch (1.5 mg total) onto the skin every 3 (three) days.  Return in about 4 weeks (around 06/01/2018) for side effects of med.    Myles Lipps, MD Primary Care at [facility] [address] [place], [place] [address] Ph.  [phone] Fax [phone]

## 2018-05-04 NOTE — Patient Instructions (Signed)
° ° ° °  If you have lab work done today you will be contacted with your lab results within the next 2 weeks.  If you have not heard from us then please contact us. The fastest way to get your results is to register for My Chart. ° ° °IF you received an x-ray today, you will receive an invoice from [facility]. Please contact [facility] at [phone] with questions or concerns regarding your invoice.  ° °IF you received labwork today, you will receive an invoice from [facility]. Please contact [facility] at [phone] with questions or concerns regarding your invoice.  ° °Our billing staff will not be able to assist you with questions regarding bills from these companies. ° °You will be contacted with the lab results as soon as they are available. The fastest way to get your results is to activate your My Chart account. Instructions are located on the last page of this paperwork. If you have not heard from us regarding the results in 2 weeks, please contact this office. °  ° ° ° °

## 2018-05-15 ENCOUNTER — Ambulatory Visit: Payer: No Typology Code available for payment source | Admitting: Clinical

## 2018-05-15 DIAGNOSIS — F331 Major depressive disorder, recurrent, moderate: Secondary | ICD-10-CM

## 2018-05-24 MED FILL — CIPROFLOXACIN HCL 500 MG TA: 500 | 1 days supply | Qty: 1 | Fill #0

## 2018-05-29 ENCOUNTER — Ambulatory Visit (INDEPENDENT_AMBULATORY_CARE_PROVIDER_SITE_OTHER): Payer: No Typology Code available for payment source | Admitting: Family Medicine

## 2018-05-29 ENCOUNTER — Other Ambulatory Visit: Payer: Self-pay

## 2018-05-29 ENCOUNTER — Encounter: Payer: Self-pay | Admitting: Family Medicine

## 2018-05-29 VITALS — BP 115/74 | HR 81 | Temp 97.6°F | Ht 68.7 in | Wt 255.0 lb

## 2018-05-29 DIAGNOSIS — F419 Anxiety disorder, unspecified: Secondary | ICD-10-CM

## 2018-05-29 DIAGNOSIS — B360 Pityriasis versicolor: Secondary | ICD-10-CM

## 2018-05-29 DIAGNOSIS — F329 Major depressive disorder, single episode, unspecified: Secondary | ICD-10-CM | POA: Diagnosis not present

## 2018-05-29 DIAGNOSIS — F32A Depression, unspecified: Secondary | ICD-10-CM

## 2018-05-29 MED ORDER — SCOPOLAMINE 1 MG/3DAYS TD PT72: 1.0000 | MEDICATED_PATCH | TRANSDERMAL | 12 refills | Status: DC

## 2018-05-29 MED ORDER — KETOCONAZOLE 2 % EX SHAM: 1.0000 "application " | MEDICATED_SHAMPOO | CUTANEOUS | 0 refills | Status: DC

## 2018-05-29 MED FILL — KETOCONAZOLE 2% SHAMPOO: 2 | 30 days supply | Qty: 120 | Fill #0

## 2018-05-29 MED FILL — SCOPOLAMINE 1 MG/3DAYS PT72: 1 | 30 days supply | Qty: 10 | Fill #0

## 2018-05-29 NOTE — Patient Instructions (Signed)
° ° ° °  If you have lab work done today you will be contacted with your lab results within the next 2 weeks.  If you have not heard from us then please contact us. The fastest way to get your results is to register for My Chart. ° ° °IF you received an x-ray today, you will receive an invoice from [facility]. Please contact [facility] at [phone] with questions or concerns regarding your invoice.  ° °IF you received labwork today, you will receive an invoice from [facility]. Please contact [facility] at [phone] with questions or concerns regarding your invoice.  ° °Our billing staff will not be able to assist you with questions regarding bills from these companies. ° °You will be contacted with the lab results as soon as they are available. The fastest way to get your results is to activate your My Chart account. Instructions are located on the last page of this paperwork. If you have not heard from us regarding the results in 2 weeks, please contact this office. °  ° ° ° °

## 2018-05-29 NOTE — Progress Notes (Signed)
10/21/201911:55 AM  Kathleen Lambert 1986-01-16, 32 y.o. female [id number]  Chief Complaint  Patient presents with  . Medication Refill    chk on new medication started, the transderm-scop. Says there is no reaction to the medication, all is well    HPI:   Patient is a 32 y.o. female with past medical history significant for depression and anxiety who presents today for followup  Patient was having significant diaphoresis with activity since started on antidepressants Last visit we started scopolamine patch as she felt she was doing well mood wise Sweating is much better since started patch Tried weaning off wellbutrin but depression got worse  Otherwise has a dark rash on her neck Noticed recently Does not itch  Fall Risk  05/29/2018 05/04/2018 03/03/2018 02/02/2018 01/23/2018  Falls in the past year? No No No No No     Depression screen [facility] 2/9 05/29/2018 05/04/2018 03/03/2018  Decreased Interest 0 0 0  Down, Depressed, Hopeless 0 0 0  PHQ - 2 Score 0 0 0  Altered sleeping - - -  Tired, decreased energy - - -  Change in appetite - - -  Feeling bad or failure about yourself  - - -  Trouble concentrating - - -  Moving slowly or fidgety/restless - - -  Suicidal thoughts - - -  PHQ-9 Score - - -  Difficult doing work/chores - - -    Allergies  Allergen Reactions  . Augmentin [Amoxicillin-Pot Clavulanate] Nausea Only    Prior to Admission medications   Medication Sig Start Date End Date Taking? Authorizing Provider  aluminum chloride (DRYSOL) 20 % external solution Apply topically at bedtime. 11/29/17  Yes Jeffery, Chelle, PA  buPROPion (WELLBUTRIN XL) 150 MG 24 hr tablet Take 1 tablet (150 mg total) by mouth daily. 11/02/17  Yes Jeffery, Chelle, PA  escitalopram (LEXAPRO) 10 MG tablet Take 1 tablet (10 mg total) by mouth daily. 03/03/18  Yes Weber, Dema Severin, PA-C  meloxicam (MOBIC) 15 MG tablet Take 15 mg by mouth daily. 11/02/17  Yes [provider]  ondansetron  (ZOFRAN-ODT) 4 MG disintegrating tablet Take 1-2 tablets (4-8 mg total) by mouth every 8 (eight) hours as needed for nausea or vomiting. 11/02/17  Yes Jeffery, Chelle, PA  scopolamine (TRANSDERM-SCOP) 1 MG/3DAYS Place 1 patch (1.5 mg total) onto the skin every 3 (three) days. 05/04/18  Yes Myles Lipps, MD    Past Medical History:  Diagnosis Date  . Allergy   . Contraceptive management   . Frequent headaches     Past Surgical History:  Procedure Laterality Date  . WISDOM TOOTH EXTRACTION      Social History   Tobacco Use  . Smoking status: Never Smoker  . Smokeless tobacco: Never Used  Substance Use Topics  . Alcohol use: Yes    Alcohol/week: 1.0 standard drinks    Types: 1 Standard drinks or equivalent per week    Comment: social rare    Family History  Problem Relation Age of Onset  . Mental illness Mother        borderline personality  . Hypertension Father   . Hyperlipidemia Father   . Breast cancer Paternal Aunt   . Lung cancer Paternal Uncle   . Diabetes Maternal Grandmother   . Heart disease Maternal Grandmother   . Hypertension Maternal Grandmother   . Heart disease Maternal Grandfather   . Stroke Paternal Grandfather   . Heart disease Paternal Grandfather     ROS Per  hpi  OBJECTIVE:  Blood pressure 115/74, pulse 81, temperature 97.6 F (36.4 C), temperature source Oral, height 5' 8.7" (1.745 m), weight 255 lb (115.7 kg), last menstrual period 05/25/2018, SpO2 96 %. Body mass index is 37.99 kg/m.   Physical Exam  Constitutional: She is oriented to person, place, and time. She appears well-developed and well-nourished.  HENT:  Head: Normocephalic and atraumatic.  Mouth/Throat: Mucous membranes are normal.  Eyes: Pupils are equal, round, and reactive to light. Conjunctivae and EOM are normal. No scleral icterus.  Neck: Neck supple.  Pulmonary/Chest: Effort normal.  Neurological: She is alert and oriented to person, place, and time.  Skin: Skin is  warm and dry. Rash (hyperpigmented macules with minimal scaliness along posterior neck ) noted.  Psychiatric: She has a normal mood and affect.  Nursing note and vitals reviewed.   ASSESSMENT and PLAN  1. Anxiety and depression Diaphoresis better, manageable with patch. Discussed long term use if not a solution. Consider cymbalta. Will do trial of less use now that it is getting colder. Reviewed r/se/b of scopolamine.   2. Tinea versicolor rx for ketoconazole given  Other orders - ketoconazole (NIZORAL) 2 % shampoo; Apply 1 application topically 2 (two) times a week. - scopolamine (TRANSDERM-SCOP) 1 MG/3DAYS; Place 1 patch (1.5 mg total) onto the skin every 3 (three) days.  Return in about 3 months (around 08/29/2018).    Myles Lipps, MD Primary Care at [facility] [address] [place], [place] [address] Ph.  [phone] Fax [phone]

## 2018-06-14 ENCOUNTER — Other Ambulatory Visit: Payer: Self-pay

## 2018-06-14 ENCOUNTER — Encounter: Payer: Self-pay | Admitting: Family Medicine

## 2018-06-14 ENCOUNTER — Ambulatory Visit (INDEPENDENT_AMBULATORY_CARE_PROVIDER_SITE_OTHER): Payer: No Typology Code available for payment source | Admitting: Family Medicine

## 2018-06-14 VITALS — BP 124/70 | HR 81 | Temp 98.8°F | Resp 14 | Ht 68.0 in | Wt 252.4 lb

## 2018-06-14 DIAGNOSIS — J22 Unspecified acute lower respiratory infection: Secondary | ICD-10-CM | POA: Diagnosis not present

## 2018-06-14 DIAGNOSIS — R059 Cough, unspecified: Secondary | ICD-10-CM

## 2018-06-14 DIAGNOSIS — R05 Cough: Secondary | ICD-10-CM | POA: Diagnosis not present

## 2018-06-14 MED ORDER — AZITHROMYCIN 250 MG PO TABS: ORAL_TABLET | ORAL | 0 refills | Status: DC

## 2018-06-14 MED ORDER — BENZONATATE 100 MG PO CAPS
100.0000 mg | ORAL_CAPSULE | Freq: Three times a day (TID) | ORAL | 0 refills | Status: DC | PRN
Start: 2018-06-14 — End: 2018-08-10

## 2018-06-14 MED FILL — AZITHROMYCIN 250 MG TABLET: 250 | 5 days supply | Qty: 6 | Fill #0

## 2018-06-14 MED FILL — BENZONATATE 100 MG CAPS: 100 | 6 days supply | Qty: 20 | Fill #0

## 2018-06-14 NOTE — Patient Instructions (Addendum)
Tessalon Perles up to 3 times per day as needed for cough, Mucinex or Mucinex DM over-the-counter if needed, make sure to drink plenty of fluids and rest.  Teaspoon of honey at bedtime may also help with cough.  Azithromycin was sent to your pharmacy. Return to the clinic or go to the nearest emergency room if any of your symptoms worsen or new symptoms occur.  Cough, Adult Coughing is a reflex that clears your throat and your airways. Coughing helps to heal and protect your lungs. It is normal to cough occasionally, but a cough that happens with other symptoms or lasts a long time may be a sign of a condition that needs treatment. A cough may last only 2-3 weeks (acute), or it may last longer than 8 weeks (chronic). What are the causes? Coughing is commonly caused by:  Breathing in substances that irritate your lungs.  A viral or bacterial respiratory infection.  Allergies.  Asthma.  Postnasal drip.  Smoking.  Acid backing up from the stomach into the esophagus (gastroesophageal reflux).  Certain medicines.  Chronic lung problems, including COPD (or rarely, lung cancer).  Other medical conditions such as heart failure.  Follow these instructions at home: Pay attention to any changes in your symptoms. Take these actions to help with your discomfort:  Take medicines only as told by your health care provider. ? If you were prescribed an antibiotic medicine, take it as told by your health care provider. Do not stop taking the antibiotic even if you start to feel better. ? Talk with your health care provider before you take a cough suppressant medicine.  Drink enough fluid to keep your urine clear or pale yellow.  If the air is dry, use a cold steam vaporizer or humidifier in your bedroom or your home to help loosen secretions.  Avoid anything that causes you to cough at work or at home.  If your cough is worse at night, try sleeping in a semi-upright position.  Avoid cigarette  smoke. If you smoke, quit smoking. If you need help quitting, ask your health care provider.  Avoid caffeine.  Avoid alcohol.  Rest as needed.  Contact a health care provider if:  You have new symptoms.  You cough up pus.  Your cough does not get better after 2-3 weeks, or your cough gets worse.  You cannot control your cough with suppressant medicines and you are losing sleep.  You develop pain that is getting worse or pain that is not controlled with pain medicines.  You have a fever.  You have unexplained weight loss.  You have night sweats. Get help right away if:  You cough up blood.  You have difficulty breathing.  Your heartbeat is very fast. This information is not intended to replace advice given to you by your health care provider. Make sure you discuss any questions you have with your health care provider. Document Released: 01/22/2011 Document Revised: 01/01/2016 Document Reviewed: 10/02/2014 Elsevier Interactive Patient Education  [employer].     If you have lab work done today you will be contacted with your lab results within the next 2 weeks.  If you have not heard from [place] then please contact us. The fastest way to get your results is to register for My Chart.   IF you received an x-ray today, you will receive an invoice from [facility]. Please contact [facility] at [phone] with questions or concerns regarding your invoice.   IF you received labwork  today, you will receive an invoice from [place]. Please contact [facility] at [phone] with questions or concerns regarding your invoice.   Our billing staff will not be able to assist you with questions regarding bills from these companies.  You will be contacted with the lab results as soon as they are available. The fastest way to get your results is to activate your My Chart account. Instructions are located on the last page of this paperwork. If you have not heard  from [place] regarding the results in 2 weeks, please contact this office.

## 2018-06-14 NOTE — Progress Notes (Signed)
Subjective:  By signing my name below, I, Stann Ore, attest that this documentation has been prepared under the direction and in the presence of Meredith Staggers, MD. Electronically Signed: Stann Ore, Scribe. 06/14/2018 , 2:20 PM .  Patient was seen in Room 9 .   Patient ID: Kathleen Lambert, female    DOB: 12-25-1985, 32 y.o.   MRN: [id number] Chief Complaint  Patient presents with  . Cough     with chest congestion x9 days   HPI Kathleen Lambert is a 32 y.o. female  Patient states her symptoms started with a cough initially 9 days ago. She notes it started with cough, congestion, rhinorrhea, sinus pressure and some body aches. She noticed more chest congestion started 5 days ago. She's been coughing up green mucus, with slight worsening due to more frequent and the amount of discolored mucus. She's been taking OTC Dayquil during the day and the night. She denies fever, or headache. She has been able to sleep at night. She denies history of asthma. She has tried Mucinex with so-so relief. She didn't do well on hydrocodone in the past.   She works as a Engineer, civil (consulting) in [employer], started 3 years ago. She graduated from [employer] nursing school in 2011.   Patient Active Problem List   Diagnosis Date Noted  . Excessive sweating, local 11/29/2017  . Anxiety and depression 08/29/2017   Past Medical History:  Diagnosis Date  . Allergy   . Contraceptive management   . Frequent headaches    Past Surgical History:  Procedure Laterality Date  . WISDOM TOOTH EXTRACTION     Allergies  Allergen Reactions  . Augmentin [Amoxicillin-Pot Clavulanate] Nausea Only   Prior to Admission medications   Medication Sig Start Date End Date Taking? Authorizing Provider  aluminum chloride (DRYSOL) 20 % external solution Apply topically at bedtime. 11/29/17   Porfirio Oar, PA  buPROPion (WELLBUTRIN XL) 150 MG 24 hr tablet Take 1 tablet (150 mg total) by mouth daily. 11/02/17   Porfirio Oar, PA   escitalopram (LEXAPRO) 10 MG tablet Take 1 tablet (10 mg total) by mouth daily. 03/03/18   Weber, Dema Severin, PA-C  ketoconazole (NIZORAL) 2 % shampoo Apply 1 application topically 2 (two) times a week. 05/29/18   Myles Lipps, MD  meloxicam (MOBIC) 15 MG tablet Take 15 mg by mouth daily. 11/02/17   [provider]  ondansetron (ZOFRAN-ODT) 4 MG disintegrating tablet Take 1-2 tablets (4-8 mg total) by mouth every 8 (eight) hours as needed for nausea or vomiting. 11/02/17   Porfirio Oar, PA  scopolamine (TRANSDERM-SCOP) 1 MG/3DAYS Place 1 patch (1.5 mg total) onto the skin every 3 (three) days. 05/29/18   Myles Lipps, MD   Social History   Socioeconomic History  . Marital status: Single    Spouse name: Not on file  . Number of children: 0  . Years of education: Not on file  . Highest education level: Bachelor's degree (e.g., BA, AB, BS)  Occupational History  . Occupation: Charity fundraiser    Comment: ED (in [place])  Social Needs  . Financial resource strain: Not on file  . Food insecurity:    Worry: Not on file    Inability: Not on file  . Transportation needs:    Medical: Not on file    Non-medical: Not on file  Tobacco Use  . Smoking status: Never Smoker  . Smokeless tobacco: Never Used  Substance and Sexual Activity  . Alcohol use:  Yes    Alcohol/week: 1.0 standard drinks    Types: 1 Standard drinks or equivalent per week    Comment: social rare  . Drug use: No  . Sexual activity: Not Currently  Lifestyle  . Physical activity:    Days per week: 2 days    Minutes per session: Not on file  . Stress: Not on file  Relationships  . Social connections:    Talks on phone: Not on file    Gets together: Not on file    Attends religious service: Not on file    Active member of club or organization: Not on file    Attends meetings of clubs or organizations: Not on file    Relationship status: Not on file  . Intimate partner violence:    Fear of current or ex partner:  Not on file    Emotionally abused: Not on file    Physically abused: Not on file    Forced sexual activity: Not on file  Other Topics Concern  . Not on file  Social History Narrative   Divorced.   Lives alone with her cats, Katniss and Mogli.   Review of Systems  Constitutional: Negative for chills, fatigue, fever and unexpected weight change.  HENT: Positive for congestion, rhinorrhea and sinus pressure.   Respiratory: Positive for cough. Negative for shortness of breath and wheezing.   Gastrointestinal: Negative for constipation, diarrhea, nausea and vomiting.  Musculoskeletal: Positive for myalgias.  Skin: Negative for rash and wound.  Neurological: Negative for dizziness, weakness and headaches.       Objective:   Physical Exam  Constitutional: She is oriented to person, place, and time. She appears well-developed and well-nourished. No distress.  HENT:  Head: Normocephalic and atraumatic.  Right Ear: Hearing, tympanic membrane, external ear and ear canal normal.  Left Ear: Hearing, tympanic membrane, external ear and ear canal normal.  Nose: Nose normal.  Mouth/Throat: Oropharynx is clear and moist. No oropharyngeal exudate.  Eyes: Pupils are equal, round, and reactive to light. Conjunctivae and EOM are normal.  Cardiovascular: Normal rate, regular rhythm, normal heart sounds and intact distal pulses.  No murmur heard. Pulmonary/Chest: Effort normal and breath sounds normal. No respiratory distress. She has no wheezes. She has no rhonchi.  Neurological: She is alert and oriented to person, place, and time.  Skin: Skin is warm and dry. No rash noted.  Psychiatric: She has a normal mood and affect. Her behavior is normal.  Vitals reviewed.   Vitals:   06/14/18 1348 06/14/18 1416  BP: (!) 146/86 124/70  Pulse: 81   Resp: 14   Temp: 98.8 F (37.1 C)   TempSrc: Oral   SpO2: 98%   Weight: 252 lb 6.4 oz (114.5 kg)   Height: 5\' 8"  (1.727 m)        Assessment & Plan:   Kathleen Lambert is a 32 y.o. female Cough - Plan: benzonatate (TESSALON) 100 MG capsule  LRTI (lower respiratory tract infection) - Plan: azithromycin (ZITHROMAX) 250 MG tablet  -Possible early viral illness now with worsening cough, discolored mucus production.  Possible atypical/lower respiratory tract infection.  Reassuring vitals and exam.  -Tessalon Perles during the day as needed, Mucinex over-the-counter as needed, azithromycin prescribed with potential side effects and risks discussed.  Option of waiting a few days to see if symptoms start to improve.   -RTC precautions if worsening  Meds ordered this encounter  Medications  . azithromycin (ZITHROMAX) 250 MG tablet  Sig: Take 2 pills by mouth on day 1, then 1 pill by mouth per day on days 2 through 5.    Dispense:  6 tablet    Refill:  0  . benzonatate (TESSALON) 100 MG capsule    Sig: Take 1 capsule (100 mg total) by mouth 3 (three) times daily as needed for cough.    Dispense:  20 capsule    Refill:  0   Patient Instructions   Tessalon Perles up to 3 times per day as needed for cough, Mucinex or Mucinex DM over-the-counter if needed, make sure to drink plenty of fluids and rest.  Teaspoon of honey at bedtime may also help with cough.  Azithromycin was sent to your pharmacy. Return to the clinic or go to the nearest emergency room if any of your symptoms worsen or new symptoms occur.  Cough, Adult Coughing is a reflex that clears your throat and your airways. Coughing helps to heal and protect your lungs. It is normal to cough occasionally, but a cough that happens with other symptoms or lasts a long time may be a sign of a condition that needs treatment. A cough may last only 2-3 weeks (acute), or it may last longer than 8 weeks (chronic). What are the causes? Coughing is commonly caused by:  Breathing in substances that irritate your lungs.  A viral or bacterial respiratory  infection.  Allergies.  Asthma.  Postnasal drip.  Smoking.  Acid backing up from the stomach into the esophagus (gastroesophageal reflux).  Certain medicines.  Chronic lung problems, including COPD (or rarely, lung cancer).  Other medical conditions such as heart failure.  Follow these instructions at home: Pay attention to any changes in your symptoms. Take these actions to help with your discomfort:  Take medicines only as told by your health care provider. ? If you were prescribed an antibiotic medicine, take it as told by your health care provider. Do not stop taking the antibiotic even if you start to feel better. ? Talk with your health care provider before you take a cough suppressant medicine.  Drink enough fluid to keep your urine clear or pale yellow.  If the air is dry, use a cold steam vaporizer or humidifier in your bedroom or your home to help loosen secretions.  Avoid anything that causes you to cough at work or at home.  If your cough is worse at night, try sleeping in a semi-upright position.  Avoid cigarette smoke. If you smoke, quit smoking. If you need help quitting, ask your health care provider.  Avoid caffeine.  Avoid alcohol.  Rest as needed.  Contact a health care provider if:  You have new symptoms.  You cough up pus.  Your cough does not get better after 2-3 weeks, or your cough gets worse.  You cannot control your cough with suppressant medicines and you are losing sleep.  You develop pain that is getting worse or pain that is not controlled with pain medicines.  You have a fever.  You have unexplained weight loss.  You have night sweats. Get help right away if:  You cough up blood.  You have difficulty breathing.  Your heartbeat is very fast. This information is not intended to replace advice given to you by your health care provider. Make sure you discuss any questions you have with your health care provider. Document  Released: 01/22/2011 Document Revised: 01/01/2016 Document Reviewed: 10/02/2014 Elsevier Interactive Patient Education  [employer].  If you have lab work done today you will be contacted with your lab results within the next 2 weeks.  If you have not heard from [place] then please contact us. The fastest way to get your results is to register for My Chart.   IF you received an x-ray today, you will receive an invoice from [facility]. Please contact [facility] at [phone] with questions or concerns regarding your invoice.   IF you received labwork today, you will receive an invoice from [place]. Please contact [facility] at [phone] with questions or concerns regarding your invoice.   Our billing staff will not be able to assist you with questions regarding bills from these companies.  You will be contacted with the lab results as soon as they are available. The fastest way to get your results is to activate your My Chart account. Instructions are located on the last page of this paperwork. If you have not heard from [place] regarding the results in 2 weeks, please contact this office.       I personally performed the services described in this documentation, which was scribed in my presence. The recorded information has been reviewed and considered for accuracy and completeness, addended by me as needed, and agree with information above.  Signed,   Meredith Staggers, MD Primary Care at [facility].  06/14/18 2:35 PM

## 2018-06-15 ENCOUNTER — Ambulatory Visit: Payer: No Typology Code available for payment source | Admitting: Clinical

## 2018-06-15 DIAGNOSIS — F331 Major depressive disorder, recurrent, moderate: Secondary | ICD-10-CM | POA: Diagnosis not present

## 2018-07-07 ENCOUNTER — Telehealth: Payer: Self-pay | Admitting: Family Medicine

## 2018-07-07 NOTE — Telephone Encounter (Signed)
MyChart message sent to patient about rescheduling their appt on 08/29/18 °

## 2018-07-12 ENCOUNTER — Ambulatory Visit (INDEPENDENT_AMBULATORY_CARE_PROVIDER_SITE_OTHER): Payer: No Typology Code available for payment source | Admitting: Clinical

## 2018-07-12 DIAGNOSIS — F331 Major depressive disorder, recurrent, moderate: Secondary | ICD-10-CM

## 2018-07-12 MED FILL — MELOXICAM 15 MG TABLET: 15 | 60 days supply | Qty: 60 | Fill #1

## 2018-07-12 MED FILL — ESCITALOPRAM 10 MG TABLET: 10 | 90 days supply | Qty: 90 | Fill #1

## 2018-08-10 ENCOUNTER — Other Ambulatory Visit: Payer: Self-pay

## 2018-08-10 ENCOUNTER — Ambulatory Visit (INDEPENDENT_AMBULATORY_CARE_PROVIDER_SITE_OTHER): Payer: No Typology Code available for payment source | Admitting: Family Medicine

## 2018-08-10 ENCOUNTER — Encounter: Payer: Self-pay | Admitting: Family Medicine

## 2018-08-10 ENCOUNTER — Encounter

## 2018-08-10 VITALS — BP 127/85 | HR 74 | Temp 98.9°F | Resp 16 | Ht 68.0 in | Wt 256.6 lb

## 2018-08-10 DIAGNOSIS — R062 Wheezing: Secondary | ICD-10-CM

## 2018-08-10 DIAGNOSIS — F329 Major depressive disorder, single episode, unspecified: Secondary | ICD-10-CM

## 2018-08-10 DIAGNOSIS — F419 Anxiety disorder, unspecified: Secondary | ICD-10-CM | POA: Diagnosis not present

## 2018-08-10 DIAGNOSIS — F32A Depression, unspecified: Secondary | ICD-10-CM

## 2018-08-10 MED ORDER — ALBUTEROL SULFATE HFA 108 (90 BASE) MCG/ACT IN AERS: 2.0000 | INHALATION_SPRAY | Freq: Four times a day (QID) | RESPIRATORY_TRACT | 0 refills | Status: DC | PRN

## 2018-08-10 MED ORDER — BUPROPION HCL 75 MG PO TABS: 75.0000 mg | ORAL_TABLET | Freq: Two times a day (BID) | ORAL | 0 refills | Status: DC

## 2018-08-10 MED ORDER — DULOXETINE HCL 30 MG PO CPEP: 30.0000 mg | ORAL_CAPSULE | Freq: Every day | ORAL | 0 refills | Status: DC

## 2018-08-10 MED ORDER — DULOXETINE HCL 60 MG PO CPEP: 60.0000 mg | ORAL_CAPSULE | Freq: Every day | ORAL | 3 refills | Status: DC

## 2018-08-10 MED FILL — DULoxetine HCL 30 MG CPEP: 30 | 30 days supply | Qty: 30 | Fill #0

## 2018-08-10 MED FILL — buPROPion HCL 75 MG TABS: 75 | 15 days supply | Qty: 30 | Fill #0

## 2018-08-10 MED FILL — VENTOLIN HFA 90 MCG INHALER: 108 (90 BAS | 25 days supply | Qty: 18 | Fill #0

## 2018-08-10 MED FILL — DULoxetine HCL 60 MG CPEP: 60 | 30 days supply | Qty: 30 | Fill #0

## 2018-08-10 NOTE — Progress Notes (Signed)
1/2/20209:23 AM  Kathleen Lambert January 18, 1986, 33 y.o. female [id number]  Chief Complaint  Patient presents with  . Depression    "I have been having this for along time about 15 years" per pt currently being treated. depression score 15    HPI:   Patient is a 33 y.o. female with past medical history significant for depression and anxiety who presents today for routine followup  Last OV oct 2019 Depression was doing better on lexapro and wellbutrin but caused diaphoresis Trial of scopolamine patch, caused constipation and hemorrhoids She has since stopped scopolamine patches and has weaned herself off lexapro, last took a week ago Feels depression is worse since then Still having diaphoresis just on wellbutrin Going to counseling, about once a month Reports passive SI, denies any plans or intent Denies any past attempts Has never seen a psychiatrist  She is also requesting albuterol inhaler Has been sick for past 4 days, URI sx, but has been a bit wheezy occasionally No fever Has had 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 0 No No No No     Depression screen [facility] 2/9 08/10/2018 05/29/2018 05/04/2018  Decreased Interest 2 0 0  Down, Depressed, Hopeless 2 0 0  PHQ - 2 Score 4 0 0  Altered sleeping 2 - -  Tired, decreased energy 2 - -  Change in appetite 1 - -  Feeling bad or failure about yourself  2 - -  Trouble concentrating 3 - -  Moving slowly or fidgety/restless 0 - -  Suicidal thoughts 1 - -  PHQ-9 Score 15 - -  Difficult doing work/chores Very difficult - -    Allergies  Allergen Reactions  . Augmentin [Amoxicillin-Pot Clavulanate] Nausea Only    Prior to Admission medications   Medication Sig Start Date End Date Taking? Authorizing Provider  buPROPion (WELLBUTRIN XL) 150 MG 24 hr tablet Take 1 tablet (150 mg total) by mouth daily. 11/02/17   Porfirio Oar, PA  escitalopram  (LEXAPRO) 10 MG tablet Take 1 tablet (10 mg total) by mouth daily. 03/03/18   Weber, Dema Severin, PA-C  meloxicam (MOBIC) 15 MG tablet Take 15 mg by mouth daily. 11/02/17   [provider]    Past Medical History:  Diagnosis Date  . Allergy   . Contraceptive management   . Frequent headaches     Past Surgical History:  Procedure Laterality Date  . WISDOM TOOTH EXTRACTION      Social History   Tobacco Use  . Smoking status: Never Smoker  . Smokeless tobacco: Never Used  Substance Use Topics  . Alcohol use: Yes    Alcohol/week: 1.0 standard drinks    Types: 1 Standard drinks or equivalent per week    Comment: social rare    Family History  Problem Relation Age of Onset  . Mental illness Mother        borderline personality  . Hypertension Father   . Hyperlipidemia Father   . Breast cancer Paternal Aunt   . Lung cancer Paternal Uncle   . Diabetes Maternal Grandmother   . Heart disease Maternal Grandmother   . Hypertension Maternal Grandmother   . Heart disease Maternal Grandfather   . Stroke Paternal Grandfather   . Heart disease Paternal Grandfather     ROS Per hpi  OBJECTIVE:  Blood pressure 127/85, pulse 74, temperature 98.9 F (37.2 C), temperature source Oral, resp.  rate 16, height 5\' 8"  (1.727 m), weight 256 lb 9.6 oz (116.4 kg), last menstrual period 07/10/2018, SpO2 96 %. Body mass index is 39.02 kg/m.   Physical Exam Vitals signs and nursing note reviewed.  Constitutional:      Appearance: She is well-developed.  HENT:     Head: Normocephalic and atraumatic.     Mouth/Throat:     Pharynx: No oropharyngeal exudate.  Eyes:     General: No scleral icterus.    Conjunctiva/sclera: Conjunctivae normal.     Pupils: Pupils are equal, round, and reactive to light.  Neck:     Musculoskeletal: Neck supple.  Cardiovascular:     Rate and Rhythm: Normal rate and regular rhythm.     Heart sounds: Normal heart sounds. No murmur. No friction rub. No  gallop.   Pulmonary:     Effort: Pulmonary effort is normal.     Breath sounds: Normal breath sounds. No wheezing or rales.  Skin:    General: Skin is warm and dry.  Neurological:     Mental Status: She is alert and oriented to person, place, and time.     ASSESSMENT and PLAN  1. Anxiety and depression Uncontrolled. Having issues with medication side effects. endorses passive SI. Discussed weaning off wellbutrin, weaning up duloxetine. Consider low dose abilify. Referring to psych. RTC precautions reviewed.  - Ambulatory referral to Psychiatry  2. Wheezing None on exam today, refilling albuterol  Other orders - DULoxetine (CYMBALTA) 30 MG capsule; Take 1 capsule (30 mg total) by mouth daily. - DULoxetine (CYMBALTA) 60 MG capsule; Take 1 capsule (60 mg total) by mouth daily. - buPROPion (WELLBUTRIN) 75 MG tablet; Take 1 tablet (75 mg total) by mouth 2 (two) times daily. - albuterol (PROVENTIL HFA;VENTOLIN HFA) 108 (90 Base) MCG/ACT inhaler; Inhale 2 puffs into the lungs every 6 (six) hours as needed for wheezing or shortness of breath.  Return in about 4 weeks (around 09/07/2018).    Myles Lipps, MD Primary Care at [facility] [address] [place], [place] [address] Ph.  [phone] Fax [phone]

## 2018-08-10 NOTE — Patient Instructions (Signed)
° ° ° °  If you have lab work done today you will be contacted with your lab results within the next 2 weeks.  If you have not heard from us then please contact us. The fastest way to get your results is to register for My Chart. ° ° °IF you received an x-ray today, you will receive an invoice from [facility]. Please contact [facility] at [phone] with questions or concerns regarding your invoice.  ° °IF you received labwork today, you will receive an invoice from [facility]. Please contact [facility] at [phone] with questions or concerns regarding your invoice.  ° °Our billing staff will not be able to assist you with questions regarding bills from these companies. ° °You will be contacted with the lab results as soon as they are available. The fastest way to get your results is to activate your My Chart account. Instructions are located on the last page of this paperwork. If you have not heard from us regarding the results in 2 weeks, please contact this office. °  ° ° ° °

## 2018-08-29 ENCOUNTER — Ambulatory Visit: Payer: No Typology Code available for payment source | Admitting: Family Medicine

## 2018-08-31 ENCOUNTER — Ambulatory Visit (INDEPENDENT_AMBULATORY_CARE_PROVIDER_SITE_OTHER): Payer: No Typology Code available for payment source | Admitting: Clinical

## 2018-08-31 DIAGNOSIS — F331 Major depressive disorder, recurrent, moderate: Secondary | ICD-10-CM | POA: Diagnosis not present

## 2018-09-05 ENCOUNTER — Ambulatory Visit (INDEPENDENT_AMBULATORY_CARE_PROVIDER_SITE_OTHER): Payer: No Typology Code available for payment source | Admitting: Clinical

## 2018-09-05 DIAGNOSIS — F331 Major depressive disorder, recurrent, moderate: Secondary | ICD-10-CM | POA: Diagnosis not present

## 2018-09-08 ENCOUNTER — Other Ambulatory Visit: Payer: Self-pay

## 2018-09-08 ENCOUNTER — Ambulatory Visit (INDEPENDENT_AMBULATORY_CARE_PROVIDER_SITE_OTHER): Payer: No Typology Code available for payment source | Admitting: Family Medicine

## 2018-09-08 ENCOUNTER — Encounter: Payer: Self-pay | Admitting: Family Medicine

## 2018-09-08 VITALS — BP 115/76 | HR 96 | Temp 97.6°F | Resp 18 | Ht 68.5 in | Wt 265.2 lb

## 2018-09-08 DIAGNOSIS — F419 Anxiety disorder, unspecified: Secondary | ICD-10-CM

## 2018-09-08 DIAGNOSIS — F329 Major depressive disorder, single episode, unspecified: Secondary | ICD-10-CM | POA: Diagnosis not present

## 2018-09-08 DIAGNOSIS — F32A Depression, unspecified: Secondary | ICD-10-CM

## 2018-09-08 DIAGNOSIS — T887XXA Unspecified adverse effect of drug or medicament, initial encounter: Secondary | ICD-10-CM | POA: Diagnosis not present

## 2018-09-08 DIAGNOSIS — K644 Residual hemorrhoidal skin tags: Secondary | ICD-10-CM

## 2018-09-08 MED ORDER — SERTRALINE HCL 50 MG PO TABS: 50.0000 mg | ORAL_TABLET | Freq: Every day | ORAL | 3 refills | Status: DC

## 2018-09-08 MED ORDER — HYDROCORTISONE ACETATE 25 MG RE SUPP: 25.0000 mg | Freq: Two times a day (BID) | RECTAL | 0 refills | Status: DC

## 2018-09-08 MED ORDER — ARIPIPRAZOLE 2 MG PO TABS: 2.0000 mg | ORAL_TABLET | Freq: Every day | ORAL | 1 refills | Status: DC

## 2018-09-08 MED FILL — ARIPiprazole 2 MG TABS: 2 | 30 days supply | Qty: 30 | Fill #0

## 2018-09-08 MED FILL — HYDROCORTISONE ACETATE 25 M: 25 | 6 days supply | Qty: 12 | Fill #0

## 2018-09-08 MED FILL — SERTRALINE HCL 50 MG TABLET: 50 | 30 days supply | Qty: 30 | Fill #0

## 2018-09-08 NOTE — Patient Instructions (Signed)
° ° ° °  If you have lab work done today you will be contacted with your lab results within the next 2 weeks.  If you have not heard from us then please contact us. The fastest way to get your results is to register for My Chart. ° ° °IF you received an x-ray today, you will receive an invoice from [facility]. Please contact [facility] at [phone] with questions or concerns regarding your invoice.  ° °IF you received labwork today, you will receive an invoice from [facility]. Please contact [facility] at [phone] with questions or concerns regarding your invoice.  ° °Our billing staff will not be able to assist you with questions regarding bills from these companies. ° °You will be contacted with the lab results as soon as they are available. The fastest way to get your results is to activate your My Chart account. Instructions are located on the last page of this paperwork. If you have not heard from us regarding the results in 2 weeks, please contact this office. °  ° ° ° °

## 2018-09-08 NOTE — Progress Notes (Signed)
1/31/202011:33 AM  Kathleen GoldsAlicia [name] 07/20/1986, 33 y.o. female [id number]  Chief Complaint  Patient presents with  . Depression    screening done score 15  . Anxiety    screening done score 9    HPI:   Patient is a 33 y.o. female with past medical history significant for depression and anxiety who presents today for routine followup  Last OV a month ago Recently changed from lexapro and wellbutrin to duloxetine due to sign diaphoresis with meds Had to stop duloxetine due to constipation, having hemorrhoids, mild bleeding, constipation has resolved, OTC hemorrhoid treatment not helping Also referred to psychiatry - sees psychiatry on feb 14th She is involved in counseling - getting really tough - being referred for EMDR Wants to get back on to zoloft despite cause of diaphoresis Endorses passive SI, denies any active SI    Fall Risk  08/10/2018 05/29/2018 05/04/2018 03/03/2018 02/02/2018  Falls in the past year? 0 No No No No    GAD 7 : Generalized Anxiety Score 09/08/2018 01/23/2018 08/16/2017  Nervous, Anxious, on Edge 2 2 1   Control/stop worrying 1 1 1   Worry too much - different things 1 1 1   Trouble relaxing 2 1 1   Restless 2 0 2  Easily annoyed or irritable 1 2 1   Afraid - awful might happen 0 0 0  Total GAD 7 Score 9 7 7   Anxiety Difficulty Somewhat difficult Somewhat difficult Somewhat difficult     [facility] screen [facility] 2/9 09/08/2018 08/10/2018 05/29/2018  Decreased Interest 2 2 0  Down, Depressed, Hopeless 3 2 0  PHQ - 2 Score 5 4 0  Altered sleeping 2 2 -  Tired, decreased energy 1 2 -  Change in appetite 2 1 -  Feeling bad or failure about yourself  2 2 -  Trouble concentrating 2 3 -  Moving slowly or fidgety/restless 0 0 -  Suicidal thoughts 1 1 -  PHQ-9 Score 15 15 -  Difficult doing work/chores Very difficult Very difficult -    Allergies  Allergen Reactions  . Augmentin [Amoxicillin-Pot Clavulanate] Nausea Only    Prior to Admission medications    Medication Sig Start Date End Date Taking? Authorizing Provider  albuterol (PROVENTIL HFA;VENTOLIN HFA) 108 (90 Base) MCG/ACT inhaler Inhale 2 puffs into the lungs every 6 (six) hours as needed for wheezing or shortness of breath. 08/10/18   Myles LippsSantiago, Aaryav Hopfensperger M, MD  buPROPion (WELLBUTRIN) 75 MG tablet Take 1 tablet (75 mg total) by mouth 2 (two) times daily. 08/10/18   Myles LippsSantiago, Kynadee Dam M, MD  DULoxetine (CYMBALTA) 30 MG capsule Take 1 capsule (30 mg total) by mouth daily. 08/10/18   Myles LippsSantiago, Viviane Semidey M, MD  DULoxetine (CYMBALTA) 60 MG capsule Take 1 capsule (60 mg total) by mouth daily. 08/31/18   Myles LippsSantiago, Izell Labat M, MD  meloxicam (MOBIC) 15 MG tablet Take 15 mg by mouth daily. 11/02/17   [provider]    Past Medical History:  Diagnosis Date  . Allergy   . Contraceptive management   . Frequent headaches     Past Surgical History:  Procedure Laterality Date  . WISDOM TOOTH EXTRACTION      Social History   Tobacco Use  . Smoking status: Never Smoker  . Smokeless tobacco: Never Used  Substance Use Topics  . Alcohol use: Yes    Alcohol/week: 1.0 standard drinks    Types: 1 Standard drinks or equivalent per week    Comment: social rare  Family History  Problem Relation Age of Onset  . Mental illness Mother        borderline personality  . Hypertension Father   . Hyperlipidemia Father   . Breast cancer Paternal Aunt   . Lung cancer Paternal Uncle   . Diabetes Maternal Grandmother   . Heart disease Maternal Grandmother   . Hypertension Maternal Grandmother   . Heart disease Maternal Grandfather   . Stroke Paternal Grandfather   . Heart disease Paternal Grandfather     ROS Per hpi  OBJECTIVE:  Blood pressure 115/76, pulse 96, temperature 97.6 F (36.4 C), temperature source Oral, resp. rate 18, height 5' 8.5" (1.74 m), weight 265 lb 3.2 oz (120.3 kg), last menstrual period 07/10/2018, SpO2 96 %. Body mass index is 39.73 kg/m.   Physical Exam Vitals signs and nursing  note reviewed.  Constitutional:      Appearance: She is well-developed.  HENT:     Head: Normocephalic and atraumatic.  Eyes:     General: No scleral icterus.    Conjunctiva/sclera: Conjunctivae normal.     Pupils: Pupils are equal, round, and reactive to light.  Neck:     Musculoskeletal: Neck supple.  Pulmonary:     Effort: Pulmonary effort is normal.  Skin:    General: Skin is warm and dry.  Neurological:     Mental Status: She is alert and oriented to person, place, and time.      ASSESSMENT and PLAN  1. Anxiety and depression 2. Medication side effects Will restart sertraline as requested by patient. Adding low dose abilify for augmentation as will not tolerate higher doses of sertraline due to diaphoresis, reviewed r/se/b.  Keep psych appt Cont with counseling Passive SI  3. External hemorrhoid anusol-HC prescribed  Other orders - sertraline (ZOLOFT) 50 MG tablet; Take 1 tablet (50 mg total) by mouth daily. - ARIPiprazole (ABILIFY) 2 MG tablet; Take 1 tablet (2 mg total) by mouth daily. - hydrocortisone (ANUSOL-HC) 25 MG suppository; Place 1 suppository (25 mg total) rectally 2 (two) times daily.  Return in about 4 weeks (around 10/06/2018).    Myles Lipps, MD Primary Care at [facility] [address] [place], [place] [address] Ph.  [phone] Fax [phone]

## 2018-09-13 ENCOUNTER — Ambulatory Visit (INDEPENDENT_AMBULATORY_CARE_PROVIDER_SITE_OTHER): Payer: No Typology Code available for payment source | Admitting: Clinical

## 2018-09-13 DIAGNOSIS — F331 Major depressive disorder, recurrent, moderate: Secondary | ICD-10-CM

## 2018-09-17 ENCOUNTER — Ambulatory Visit (INDEPENDENT_AMBULATORY_CARE_PROVIDER_SITE_OTHER): Payer: Self-pay | Admitting: Family Medicine

## 2018-09-17 ENCOUNTER — Encounter: Payer: Self-pay | Admitting: Family Medicine

## 2018-09-17 VITALS — BP 105/62 | HR 73 | Temp 98.8°F | Resp 16 | Wt 267.2 lb

## 2018-09-17 DIAGNOSIS — M545 Low back pain: Secondary | ICD-10-CM

## 2018-09-17 MED ORDER — LIDOCAINE 5 % EX PTCH: 1.0000 | MEDICATED_PATCH | CUTANEOUS | 0 refills | Status: DC

## 2018-09-17 MED ORDER — KETOROLAC TROMETHAMINE 60 MG/2ML IM SOLN
60.0000 mg | Freq: Once | INTRAMUSCULAR | Status: AC
  Administered 2018-09-17: 60 mg via INTRAMUSCULAR

## 2018-09-17 MED ORDER — CYCLOBENZAPRINE HCL 10 MG PO TABS: 10.0000 mg | ORAL_TABLET | Freq: Three times a day (TID) | ORAL | 0 refills | Status: DC | PRN

## 2018-09-17 NOTE — Progress Notes (Signed)
Kathleen Lambert is a 33 y.o. female who presents today with  who presents today with 6 days of lower back pain more concentrated on the left side. 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 She denies any other vigorous activity or recent pushing, pulling and lifting activities at work. She has been using over the counter motrin for pain relief and reports that this is only mildly effective. She reports that her cat jumped on her bed and scared her causing her to sit bolt upright and that is when she felt the pain return. She denies any loss of bowel or bladder control or that the pain radiates. She denies any symptoms of numbness or tingling and rates her current pain a 5 on a scale of 10.      Review of Systems  Constitutional: Negative for chills, fever and malaise/fatigue.  HENT: Negative for congestion, ear discharge, ear pain, sinus pain and sore throat.   Eyes: Negative.   Respiratory: Negative for cough, sputum production and shortness of breath.   Cardiovascular: Negative.  Negative for chest pain.  Gastrointestinal: Negative for abdominal pain, diarrhea, nausea and vomiting.  Genitourinary: Negative for dysuria, frequency, hematuria and urgency.  Musculoskeletal: Positive for back pain. Negative for myalgias.       Left sided  Skin: Negative.   Neurological: Negative for headaches.  Endo/Heme/Allergies: Negative.   Psychiatric/Behavioral: Negative.     Kathleen Lambert has a current medication list which includes the following prescription(s): albuterol, aripiprazole, hydrocortisone, meloxicam, sertraline, and cyclobenzaprine, and the following Facility-Administered Medications: ketorolac. Also is allergic to augmentin [amoxicillin-pot clavulanate].  Kathleen Lambert  has a past medical history of Allergy, Contraceptive management, and Frequent  headaches. Also  has a past surgical history that includes Wisdom tooth extraction.    O: Vitals:   09/17/18 1442  BP: 105/62  Pulse: 73  Resp: 16  Temp: 98.8 F (37.1 C)  SpO2: 98%     Physical Exam Vitals signs reviewed.  Constitutional:      General: She is not in acute distress.    Appearance: Normal appearance. She is well-developed. She is obese. She is not ill-appearing, toxic-appearing or diaphoretic.  HENT:     Head: Normocephalic.     Right Ear: Hearing, tympanic membrane, ear canal and external ear normal.     Left Ear: Hearing, tympanic membrane, ear canal and external ear normal.     Nose: Nose normal.     Mouth/Throat:     Pharynx: Uvula midline.  Neck:     Musculoskeletal: Normal range of motion and neck supple.  Cardiovascular:     Rate and Rhythm: Normal rate and regular rhythm.     Pulses: Normal pulses.     Heart sounds: Normal heart sounds.  Pulmonary:     Effort: Pulmonary effort is normal.     Breath sounds: Normal breath sounds.  Abdominal:     General: Bowel sounds are normal.     Palpations: Abdomen is soft.  Musculoskeletal: Normal range of motion.     Lumbar back: She exhibits pain and spasm. She exhibits normal range of motion, no tenderness, no bony tenderness, no swelling, no edema, no deformity, no laceration and normal pulse.     Comments: Pain easily reproducible with palpation of lumbar left side in office. No visual deformities or evidence  Of trauma on visualization. Patient has full ROM with some reports of mild pain- she is able to bend and walk without antalgic gait. Strength WNL and full ROM of both feet.   Lymphadenopathy:     Head:     Right side of head: No submental or submandibular adenopathy.     Left side of head: No submental or submandibular adenopathy.     Cervical: No cervical adenopathy.  Skin:    General: Skin is warm.  Neurological:     Mental Status: She is alert and oriented to person, place, and time.     A: 1. Acute left-sided low back pain without sciatica    P: Work note provided. In office Toradol did improve symptoms. Self limiting acute back strain- suspect will recover without difficulty with 7-10 days of supportive care- Discussed the benefit of using tylenol/motrin and non-pharmacologic treatment options like heat, warm soaks, topical applications, rest and light stretching.  Discussed risk of drowsiness with Flexeril- patient v/u-  No acute concerns or back pain red flags on exam. Follow up with PCP if no improvement.  1. Acute left-sided low back pain without sciatica - ketorolac (TORADOL) injection 60 mg - cyclobenzaprine (FLEXERIL) 10 MG tablet; Take 1 tablet (10 mg total) by mouth 3 (three) times daily as needed for muscle spasms (may cause drowsiness).   Discussed with patient exam findings, suspected diagnosis etiology and  reviewed recommended treatment plan and follow up, including complications and indications for urgent medical follow up and evaluation. Medications including use and indications reviewed with patient. Patient provided relevant patient education on diagnosis and/or relevant related condition that were discussed and reviewed with patient at discharge. Patient verbalized understanding of information provided and agrees with plan of care (POC), all questions answered.

## 2018-09-17 NOTE — Patient Instructions (Signed)
Acute Back Pain, Adult  Acute back pain is sudden and usually short-lived. It is often caused by an injury to the muscles and tissues in the back. The injury may result from:   A muscle or ligament getting overstretched or torn (strained). Ligaments are tissues that connect bones to each other. Lifting something improperly can cause a back strain.   Wear and tear (degeneration) of the spinal disks. Spinal disks are circular tissue that provides cushioning between the bones of the spine (vertebrae).   Twisting motions, such as while playing sports or doing yard work.   A hit to the back.   Arthritis.  You may have a physical exam, lab tests, and imaging tests to find the cause of your pain. Acute back pain usually goes away with rest and home care.  Follow these instructions at home:  Managing pain, stiffness, and swelling   Take over-the-counter and prescription medicines only as told by your health care provider.   Your health care provider may recommend applying ice during the first 24-48 hours after your pain starts. To do this:  ? Put ice in a plastic bag.  ? Place a towel between your skin and the bag.  ? Leave the ice on for 20 minutes, 2-3 times a day.   If directed, apply heat to the affected area as often as told by your health care provider. Use the heat source that your health care provider recommends, such as a moist heat pack or a heating pad.  ? Place a towel between your skin and the heat source.  ? Leave the heat on for 20-30 minutes.  ? Remove the heat if your skin turns bright red. This is especially important if you are unable to feel pain, heat, or cold. You have a greater risk of getting burned.  Activity     Do not stay in bed. Staying in bed for more than 1-2 days can delay your recovery.   Sit up and stand up straight. Avoid leaning forward when you sit, or hunching over when you stand.  ? If you work at a desk, sit close to it so you do not need to lean over. Keep your chin tucked  in. Keep your neck drawn back, and keep your elbows bent at a right angle. Your arms should look like the letter "L."  ? Sit high and close to the steering wheel when you drive. Add lower back (lumbar) support to your car seat, if needed.   Take short walks on even surfaces as soon as you are able. Try to increase the length of time you walk each day.   Do not sit, drive, or stand in one place for more than 30 minutes at a time. Sitting or standing for long periods of time can put stress on your back.   Do not drive or use heavy machinery while taking prescription pain medicine.   Use proper lifting techniques. When you bend and lift, use positions that put less stress on your back:  ? Bend your knees.  ? Keep the load close to your body.  ? Avoid twisting.   Exercise regularly as told by your health care provider. Exercising helps your back heal faster and helps prevent back injuries by keeping muscles strong and flexible.   Work with a physical therapist to make a safe exercise program, as recommended by your health care provider. Do any exercises as told by your physical therapist.  Lifestyle   Maintain   a healthy weight. Extra weight puts stress on your back and makes it difficult to have good posture.   Avoid activities or situations that make you feel anxious or stressed. Stress and anxiety increase muscle tension and can make back pain worse. Learn ways to manage anxiety and stress, such as through exercise.  General instructions   Sleep on a firm mattress in a comfortable position. Try lying on your side with your knees slightly bent. If you lie on your back, put a pillow under your knees.   Follow your treatment plan as told by your health care provider. This may include:  ? Cognitive or behavioral therapy.  ? Acupuncture or massage therapy.  ? Meditation or yoga.  Contact a health care provider if:   You have pain that is not relieved with rest or medicine.   You have increasing pain going down  into your legs or buttocks.   Your pain does not improve after 2 weeks.   You have pain at night.   You lose weight without trying.   You have a fever or chills.  Get help right away if:   You develop new bowel or bladder control problems.   You have unusual weakness or numbness in your arms or legs.   You develop nausea or vomiting.   You develop abdominal pain.   You feel faint.  Summary   Acute back pain is sudden and usually short-lived.   Use proper lifting techniques. When you bend and lift, use positions that put less stress on your back.   Take over-the-counter and prescription medicines and apply heat or ice as directed by your health care provider.  This information is not intended to replace advice given to you by your health care provider. Make sure you discuss any questions you have with your health care provider.  Document Released: 07/26/2005 Document Revised: 03/02/2018 Document Reviewed: 03/09/2017  Elsevier Interactive Patient Education  2019 [employer] Inc[employer]

## 2018-09-21 MED ORDER — KETOROLAC TROMETHAMINE 60 MG/2ML IM SOLN: 60.0000 mg | Freq: Once | INTRAMUSCULAR | Status: DC

## 2018-09-22 ENCOUNTER — Ambulatory Visit (INDEPENDENT_AMBULATORY_CARE_PROVIDER_SITE_OTHER): Payer: No Typology Code available for payment source | Admitting: Psychiatry

## 2018-09-22 ENCOUNTER — Encounter (HOSPITAL_COMMUNITY): Payer: Self-pay | Admitting: Psychiatry

## 2018-09-22 VITALS — Ht 68.0 in | Wt 264.0 lb

## 2018-09-22 DIAGNOSIS — F330 Major depressive disorder, recurrent, mild: Secondary | ICD-10-CM | POA: Diagnosis not present

## 2018-09-22 DIAGNOSIS — F419 Anxiety disorder, unspecified: Secondary | ICD-10-CM

## 2018-09-22 MED FILL — CYCLOBENZAPRINE 10 MG TAB: 10 | 15 days supply | Qty: 15 | Fill #0
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
## 2018-09-28 ENCOUNTER — Ambulatory Visit (INDEPENDENT_AMBULATORY_CARE_PROVIDER_SITE_OTHER): Payer: No Typology Code available for payment source | Admitting: Clinical

## 2018-09-28 DIAGNOSIS — F331 Major depressive disorder, recurrent, moderate: Secondary | ICD-10-CM

## 2018-10-04 ENCOUNTER — Other Ambulatory Visit: Payer: Self-pay

## 2018-10-04 ENCOUNTER — Ambulatory Visit (INDEPENDENT_AMBULATORY_CARE_PROVIDER_SITE_OTHER): Payer: No Typology Code available for payment source | Admitting: Family Medicine

## 2018-10-04 ENCOUNTER — Encounter: Payer: Self-pay | Admitting: Family Medicine

## 2018-10-04 VITALS — BP 112/77 | HR 85 | Temp 98.3°F | Ht 68.0 in | Wt 267.4 lb

## 2018-10-04 DIAGNOSIS — F329 Major depressive disorder, single episode, unspecified: Secondary | ICD-10-CM

## 2018-10-04 DIAGNOSIS — F419 Anxiety disorder, unspecified: Secondary | ICD-10-CM | POA: Diagnosis not present

## 2018-10-04 DIAGNOSIS — F32A Depression, unspecified: Secondary | ICD-10-CM

## 2018-10-04 DIAGNOSIS — K644 Residual hemorrhoidal skin tags: Secondary | ICD-10-CM

## 2018-10-04 MED ORDER — HYDROCORTISONE 2.5 % RE CREA: 1.0000 "application " | TOPICAL_CREAM | Freq: Two times a day (BID) | RECTAL | 0 refills | Status: DC

## 2018-10-04 MED FILL — PROCTOZONE-HC 2.5 % CREA: 2.5 | 15 days supply | Qty: 30 | Fill #0

## 2018-10-04 NOTE — Progress Notes (Signed)
2/26/202010:04 AM  Kathleen Lambert [birth year], 33 y.o. female [id number]  Chief Complaint  Patient presents with  . Anxiety    follow up visit  . Depression    HPI:   Patient is a 33 y.o. female with past medical history significant for depression and anxiety who presents today for routine followup  Last OV a month ago Started low dose sertaline and abilify Side effects with several medications Had appt with psych on the 14th - ordered gensight testing for guidance on next medications  Doing ok Taking sertaline 50mg , tolerating ok, helps some, not so much diaphoresis  Gained about 20 lbs on abilify and did not help Genetic testing already done Sees psych again in about 2 weeks Will start EMDR in April Continues to do counseling  Having still issues with external hemorrhoids Internal ones much better with suppositories Taking colace daily   Wt Readings from Last 3 Encounters:  10/04/18 267 lb 6.4 oz (121.3 kg)  09/22/18 264 lb (119.7 kg)  09/17/18 267 lb 3.2 oz (121.2 kg)     Fall Risk  10/04/2018 09/08/2018 08/10/2018 05/29/2018 05/04/2018  Falls in the past year? 0 0 0 No No  Number falls in past yr: 0 0 - - -  Injury with Fall? 0 1 - - -     Depression screen [facility] 2/9 10/04/2018 09/08/2018 08/10/2018  Decreased Interest 0 2 2  Down, Depressed, Hopeless 0 3 2  PHQ - 2 Score 0 5 4  Altered sleeping 0 2 2  Tired, decreased energy 0 1 2  Change in appetite 1 2 1   Feeling bad or failure about yourself  1 2 2   Trouble concentrating 0 2 3  Moving slowly or fidgety/restless 0 0 0  Suicidal thoughts 1 1 1   PHQ-9 Score 3 15 15   Difficult doing work/chores Somewhat difficult Very difficult Very difficult  Some recent data might be hidden   GAD 7 : Generalized Anxiety Score 10/04/2018 09/08/2018 01/23/2018 08/16/2017  Nervous, Anxious, on Edge 1 2 2 1   Control/stop worrying 0 1 1 1   Worry too much - different things 0 1 1 1   Trouble relaxing 1 2 1 1   Restless 0 2 0 2   Easily annoyed or irritable 0 1 2 1   Afraid - awful might happen 0 0 0 0  Total GAD 7 Score 2 9 7 7   Anxiety Difficulty Somewhat difficult Somewhat difficult Somewhat difficult Somewhat difficult    Allergies  Allergen Reactions  . Augmentin [Amoxicillin-Pot Clavulanate] Nausea Only    Prior to Admission medications   Medication Sig Start Date End Date Taking? Authorizing Provider  albuterol (PROVENTIL HFA;VENTOLIN HFA) 108 (90 Base) MCG/ACT inhaler Inhale 2 puffs into the lungs every 6 (six) hours as needed for wheezing or shortness of breath. 08/10/18  Yes Myles Lipps, MD  cyclobenzaprine (FLEXERIL) 10 MG tablet Take 1 tablet (10 mg total) by mouth 3 (three) times daily as needed for muscle spasms (may cause drowsiness). 09/17/18  Yes Zachery Dauer, NP  hydrocortisone (ANUSOL-HC) 25 MG suppository Place 1 suppository (25 mg total) rectally 2 (two) times daily. 09/08/18  Yes Myles Lipps, MD  lidocaine (LIDODERM) 5 % Place 1 patch onto the skin daily. Remove & Discard patch within 12 hours or as directed by MD 09/17/18  Yes Zachery Dauer, NP  meloxicam (MOBIC) 15 MG tablet Take 15 mg by mouth daily. 11/02/17  Yes [provider]  sertraline (ZOLOFT) 50 MG tablet  Take 1 tablet (50 mg total) by mouth daily. 09/08/18  Yes Myles Lipps, MD    Past Medical History:  Diagnosis Date  . Allergy   . Contraceptive management   . Depression   . Frequent headaches   . Meralgia paraesthetica 2019    Past Surgical History:  Procedure Laterality Date  . WISDOM TOOTH EXTRACTION      Social History   Tobacco Use  . Smoking status: Never Smoker  . Smokeless tobacco: Never Used  Substance Use Topics  . Alcohol use: Yes    Alcohol/week: 1.0 standard drinks    Types: 1 Standard drinks or equivalent per week    Comment: social rare    Family History  Problem Relation Age of Onset  . Mental illness Mother        borderline personality  . Hypertension Father   .  Hyperlipidemia Father   . Breast cancer Paternal Aunt   . Lung cancer Paternal Uncle   . Diabetes Maternal Grandmother   . Heart disease Maternal Grandmother   . Hypertension Maternal Grandmother   . Heart disease Maternal Grandfather   . Stroke Paternal Grandfather   . Heart disease Paternal Grandfather     ROS Per hpi  OBJECTIVE:  Blood pressure 112/77, pulse 85, temperature 98.3 F (36.8 C), temperature source Oral, height 5\' 8"  (1.727 m), weight 267 lb 6.4 oz (121.3 kg), SpO2 96 %. Body mass index is 40.66 kg/m.   Physical Exam Vitals signs and nursing note reviewed.  Constitutional:      Appearance: She is well-developed.  HENT:     Head: Normocephalic and atraumatic.  Eyes:     General: No scleral icterus.    Conjunctiva/sclera: Conjunctivae normal.     Pupils: Pupils are equal, round, and reactive to light.  Neck:     Musculoskeletal: Neck supple.  Pulmonary:     Effort: Pulmonary effort is normal.  Skin:    General: Skin is warm and dry.  Neurological:     Mental Status: She is alert and oriented to person, place, and time.      ASSESSMENT and PLAN  1. Anxiety and depression Improved. Now under psych care, pending gene sight results. Cont current meds for now. Cont counseling  2. External hemorrhoid Discussed supportive measures. anusol cream rx.  Other orders - hydrocortisone (ANUSOL-HC) 2.5 % rectal cream; Place 1 application rectally 2 (two) times daily.  Return in about 2 months (around 12/03/2018).    Myles Lipps, MD Primary Care at [facility] [address] [place], [place] [address] Ph.  [phone] Fax [phone]

## 2018-10-04 NOTE — Patient Instructions (Signed)
° ° ° °  If you have lab work done today you will be contacted with your lab results within the next 2 weeks.  If you have not heard from us then please contact us. The fastest way to get your results is to register for My Chart. ° ° °IF you received an x-ray today, you will receive an invoice from [facility]. Please contact [facility] at [phone] with questions or concerns regarding your invoice.  ° °IF you received labwork today, you will receive an invoice from [facility]. Please contact [facility] at [phone] with questions or concerns regarding your invoice.  ° °Our billing staff will not be able to assist you with questions regarding bills from these companies. ° °You will be contacted with the lab results as soon as they are available. The fastest way to get your results is to activate your My Chart account. Instructions are located on the last page of this paperwork. If you have not heard from us regarding the results in 2 weeks, please contact this office. °  ° ° ° °

## 2018-10-16 ENCOUNTER — Ambulatory Visit (INDEPENDENT_AMBULATORY_CARE_PROVIDER_SITE_OTHER): Payer: No Typology Code available for payment source | Admitting: Clinical

## 2018-10-16 DIAGNOSIS — F331 Major depressive disorder, recurrent, moderate: Secondary | ICD-10-CM | POA: Diagnosis not present

## 2018-10-24 ENCOUNTER — Telehealth: Payer: No Typology Code available for payment source | Admitting: Nurse Practitioner

## 2018-10-24 DIAGNOSIS — R509 Fever, unspecified: Secondary | ICD-10-CM

## 2018-10-24 MED ORDER — BENZONATATE 100 MG PO CAPS: 100.0000 mg | ORAL_CAPSULE | Freq: Three times a day (TID) | ORAL | 0 refills | Status: DC | PRN

## 2018-10-24 NOTE — Progress Notes (Signed)
E-Visit for Corona Virus Screening Based on your current symptoms, it seems unlikely that your symptoms are related to the Corona virus.   Coronavirus disease 2019 (COVID-19) is a respiratory illness that can spread from person to person. The virus that causes COVID-19 is a new virus that was first identified in the country of [place] but is now found in multiple other countries and has spread to the [place].  Symptoms associated with the virus are mild to severe fever, cough, and shortness of breath. There is currently no vaccine to protect against COVID-19, and there is no specific antiviral treatment for the virus.  It is vitally important that if you feel that you have an infection such as this virus or any other virus that you stay home and away from places where you may spread it to others.  Currently, not all patients are not being tested. If the symptoms are mild and there is not a known exposure, performing the test is not indicated.  You can use medication such as A prescription cough medication called Tessalon Perles 100 mg. You may take 1-2 capsules every 8 hours as needed for cough  Reduce your risk of any infection by using the same precautions used for avoiding the common cold or flu:  . Wash your hands often with soap and warm water for at least 20 seconds.  If soap and water are not readily available, use an alcohol-based hand sanitizer with at least 60% alcohol.  . If coughing or sneezing, cover your mouth and nose by coughing or sneezing into the elbow areas of your shirt or coat, into a tissue or into your sleeve (not your hands). . Avoid shaking hands with others and consider head nods or verbal greetings only. . Avoid touching your eyes, nose, or mouth with unwashed hands.  . Avoid close contact with people who are sick. . Avoid places or events with large numbers of people in one location, like concerts or sporting events. . Carefully consider travel plans you have or are  making. . If you are planning any travel outside or inside the [place], visit the CDC's Travelers' Health webpage for the latest health notices. . If you have some symptoms but not all symptoms, continue to monitor at home and seek medical attention if your symptoms worsen. . If you are having a medical emergency, call 911.  HOME CARE . Only take medications as instructed by your medical team. . Drink plenty of fluids and get plenty of rest. . A steam or ultrasonic humidifier can help if you have congestion.   GET HELP RIGHT AWAY IF: . You develop worsening fever. . You become short of breath . You cough up blood. . Your symptoms become more severe MAKE SURE YOU   Understand these instructions.  Will watch your condition.  Will get help right away if you are not doing well or get worse.  Your e-visit answers were reviewed by a board certified advanced clinical practitioner to complete your personal care plan.  Depending on the condition, your plan could have included both over the counter or prescription medications.  If there is a problem please reply once you have received a response from your provider. Your safety is important to us.  If you have drug allergies check your prescription carefully.    You can use MyChart to ask questions about today's visit, request a non-urgent call back, or ask for a work or school excuse for 24 hours related to   this e-Visit. If it has been greater than 24 hours you will need to follow up with your provider, or enter a new e-Visit to address those concerns. You will get an e-mail in the next two days asking about your experience.  I hope that your e-visit has been valuable and will speed your recovery. Thank you for using e-visits.   6 minutes spent reviewing and documenting in chart.

## 2018-11-06 ENCOUNTER — Other Ambulatory Visit: Payer: Self-pay

## 2018-11-06 ENCOUNTER — Telehealth (INDEPENDENT_AMBULATORY_CARE_PROVIDER_SITE_OTHER): Payer: No Typology Code available for payment source | Admitting: Psychiatry

## 2018-11-06 DIAGNOSIS — F419 Anxiety disorder, unspecified: Secondary | ICD-10-CM

## 2018-11-06 DIAGNOSIS — F330 Major depressive disorder, recurrent, mild: Secondary | ICD-10-CM | POA: Diagnosis not present

## 2018-11-06 DIAGNOSIS — F4310 Post-traumatic stress disorder, unspecified: Secondary | ICD-10-CM

## 2018-11-06 MED ORDER — DESVENLAFAXINE SUCCINATE ER 50 MG PO TB24: 50.0000 mg | ORAL_TABLET | Freq: Every day | ORAL | 1 refills | Status: DC

## 2018-11-06 MED ORDER — HYDROXYZINE PAMOATE 25 MG PO CAPS: ORAL_CAPSULE | ORAL | 0 refills | Status: DC

## 2018-11-06 MED FILL — DESVENLAFAXINE SUC ER 50 MG: 50 | 30 days supply | Qty: 30 | Fill #0

## 2018-11-06 MED FILL — HYDROXYZINE PAM 25 MG CAP: 25 | 23 days supply | Qty: 45 | Fill #0
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
## 2018-11-08 ENCOUNTER — Ambulatory Visit (INDEPENDENT_AMBULATORY_CARE_PROVIDER_SITE_OTHER): Payer: No Typology Code available for payment source | Admitting: Clinical

## 2018-11-08 DIAGNOSIS — F331 Major depressive disorder, recurrent, moderate: Secondary | ICD-10-CM

## 2018-11-22 ENCOUNTER — Ambulatory Visit: Payer: No Typology Code available for payment source | Admitting: Clinical

## 2018-11-25 ENCOUNTER — Ambulatory Visit (INDEPENDENT_AMBULATORY_CARE_PROVIDER_SITE_OTHER): Payer: No Typology Code available for payment source | Admitting: Clinical

## 2018-11-25 DIAGNOSIS — F331 Major depressive disorder, recurrent, moderate: Secondary | ICD-10-CM

## 2018-11-27 ENCOUNTER — Ambulatory Visit (INDEPENDENT_AMBULATORY_CARE_PROVIDER_SITE_OTHER): Payer: No Typology Code available for payment source | Admitting: Psychology

## 2018-11-27 DIAGNOSIS — F4310 Post-traumatic stress disorder, unspecified: Secondary | ICD-10-CM

## 2018-12-05 ENCOUNTER — Ambulatory Visit (HOSPITAL_COMMUNITY): Payer: No Typology Code available for payment source | Admitting: Psychiatry

## 2018-12-07 ENCOUNTER — Ambulatory Visit (INDEPENDENT_AMBULATORY_CARE_PROVIDER_SITE_OTHER): Payer: No Typology Code available for payment source | Admitting: Psychology

## 2018-12-07 DIAGNOSIS — F4310 Post-traumatic stress disorder, unspecified: Secondary | ICD-10-CM | POA: Diagnosis not present

## 2018-12-08 MED FILL — DESVENLAFAXINE SUC ER 50 MG: 50 | 30 days supply | Qty: 30 | Fill #1

## 2018-12-11 ENCOUNTER — Other Ambulatory Visit: Payer: Self-pay

## 2018-12-11 ENCOUNTER — Ambulatory Visit (INDEPENDENT_AMBULATORY_CARE_PROVIDER_SITE_OTHER): Payer: No Typology Code available for payment source | Admitting: Psychiatry

## 2018-12-11 ENCOUNTER — Encounter (HOSPITAL_COMMUNITY): Payer: Self-pay | Admitting: Psychiatry

## 2018-12-11 DIAGNOSIS — F4310 Post-traumatic stress disorder, unspecified: Secondary | ICD-10-CM | POA: Diagnosis not present

## 2018-12-11 DIAGNOSIS — F330 Major depressive disorder, recurrent, mild: Secondary | ICD-10-CM

## 2018-12-11 MED ORDER — DESVENLAFAXINE SUCCINATE ER 100 MG PO TB24: 100.0000 mg | ORAL_TABLET | Freq: Every day | ORAL | 1 refills | Status: DC

## 2018-12-11 NOTE — Progress Notes (Signed)
Virtual Visit via Telephone Note  I connected with Kathleen Lambert on 12/11/18 at  1:40 PM EDT by telephone and verified that I am speaking with the correct person using two identifiers.   I discussed the limitations, risks, security and privacy concerns of performing an evaluation and management service by telephone and the availability of in person appointments. I also discussed with the patient that there may be a patient responsible charge related to this service. The patient expressed understanding and agreed to proceed.   History of Present Illness: Patient evaluated through phone session.  On her loss session we discontinue Zoloft and started Pristiq after reviewing her gene site testing.  Patient is tolerating Pristiq well.  She does not have sweating which she was having with the Zoloft.  Her sleep is also better.  She takes hydroxyzine as needed.  She is going to start EMDR in few days with Westley Hummer.  She denies any tremors or shakes.  Her job is a still stressful but she is handling better.  Her nightmares and flashbacks are less intense.  She still gets sometimes anxious but denies any major panic attack.  She denies any crying spells or any feeling of hopelessness or worthlessness.  Her appetite is okay.  She reported her weight is stable.  She lives by herself and working as a Designer, jewellery at [employer].   Past Psychiatric History: H/O depression and PTSD. Taking antidepressants since January 2019.  Tried Lexapro, Wellbutrin, Cymbalta, Abilify and Zoloft.  No history of psychiatric inpatient treatment, suicidal attempt, mania, psychosis or any hallucination.  We did gene site testing and results reviewed.    Observations/Objective: Mental status examination done on the phone.  Patient described her mood euthymic.  Her speech is clear and coherent.  Her thought process logical and goal-directed.  There were no flight of ideas or loose association.  She denies  any auditory or visual hallucination.  She denies any active or passive suicidal thoughts or homicidal thought.  There were no delusions.  She is alert and oriented x3.  Her cognition is intact.  Her fund of knowledge is adequate.  Her insight and judgment is okay.  Assessment and Plan: Major depressive disorder, recurrent.  Generalized anxiety disorder.  PTSD.  Recommend to try Pristiq 100 mg to help her residual symptoms of depression and anxiety.  Discussed medication side effects and benefits.  Recommend to continue hydroxyzine 25 mg as needed for anxiety and panic attacks.  Patient is going to start EMDR in few days with Marya Landry.  Recommended to call us back if she is any question or any concern.  Follow-up in 2 months.   Follow Up Instructions:    I discussed the assessment and treatment plan with the patient. The patient was provided an opportunity to ask questions and all were answered. The patient agreed with the plan and demonstrated an understanding of the instructions.   The patient was advised to call back or seek an in-person evaluation if the symptoms worsen or if the condition fails to improve as anticipated.  I provided 20 minutes of non-face-to-face time during this encounter.   Cleotis Nipper, MD

## 2018-12-13 ENCOUNTER — Ambulatory Visit (INDEPENDENT_AMBULATORY_CARE_PROVIDER_SITE_OTHER): Payer: No Typology Code available for payment source | Admitting: Psychology

## 2018-12-13 DIAGNOSIS — F4310 Post-traumatic stress disorder, unspecified: Secondary | ICD-10-CM | POA: Diagnosis not present

## 2018-12-17 ENCOUNTER — Ambulatory Visit: Payer: No Typology Code available for payment source | Admitting: Clinical

## 2018-12-20 ENCOUNTER — Ambulatory Visit (INDEPENDENT_AMBULATORY_CARE_PROVIDER_SITE_OTHER): Payer: No Typology Code available for payment source | Admitting: Psychology

## 2018-12-20 DIAGNOSIS — F4310 Post-traumatic stress disorder, unspecified: Secondary | ICD-10-CM

## 2018-12-27 ENCOUNTER — Ambulatory Visit (INDEPENDENT_AMBULATORY_CARE_PROVIDER_SITE_OTHER): Payer: No Typology Code available for payment source | Admitting: Psychology

## 2018-12-27 DIAGNOSIS — F4310 Post-traumatic stress disorder, unspecified: Secondary | ICD-10-CM

## 2018-12-29 MED FILL — DESVENLAFAXINE SUC ER 100 M: 100 | 30 days supply | Qty: 30 | Fill #0

## 2018-12-30 ENCOUNTER — Ambulatory Visit (INDEPENDENT_AMBULATORY_CARE_PROVIDER_SITE_OTHER): Payer: No Typology Code available for payment source | Admitting: Clinical

## 2018-12-30 DIAGNOSIS — F331 Major depressive disorder, recurrent, moderate: Secondary | ICD-10-CM

## 2019-01-03 ENCOUNTER — Ambulatory Visit (INDEPENDENT_AMBULATORY_CARE_PROVIDER_SITE_OTHER): Payer: No Typology Code available for payment source | Admitting: Psychology

## 2019-01-03 DIAGNOSIS — F4310 Post-traumatic stress disorder, unspecified: Secondary | ICD-10-CM

## 2019-01-08 ENCOUNTER — Ambulatory Visit: Payer: No Typology Code available for payment source | Admitting: Psychology

## 2019-01-09 ENCOUNTER — Ambulatory Visit (INDEPENDENT_AMBULATORY_CARE_PROVIDER_SITE_OTHER): Payer: No Typology Code available for payment source | Admitting: Psychology

## 2019-01-09 DIAGNOSIS — F4310 Post-traumatic stress disorder, unspecified: Secondary | ICD-10-CM | POA: Diagnosis not present

## 2019-01-16 ENCOUNTER — Ambulatory Visit (INDEPENDENT_AMBULATORY_CARE_PROVIDER_SITE_OTHER): Payer: No Typology Code available for payment source | Admitting: Psychology

## 2019-01-16 DIAGNOSIS — F4310 Post-traumatic stress disorder, unspecified: Secondary | ICD-10-CM | POA: Diagnosis not present

## 2019-01-18 ENCOUNTER — Ambulatory Visit: Payer: No Typology Code available for payment source | Admitting: Psychology

## 2019-01-24 ENCOUNTER — Encounter: Payer: Self-pay | Admitting: Family Medicine

## 2019-01-24 ENCOUNTER — Other Ambulatory Visit: Payer: Self-pay

## 2019-01-24 ENCOUNTER — Other Ambulatory Visit (HOSPITAL_COMMUNITY)
Admission: RE | Admit: 2019-01-24 | Discharge: 2019-01-24 | Disposition: A | Discharge status: Home / Self Care | Payer: No Typology Code available for payment source | Source: Ambulatory Visit | Attending: Family Medicine | Admitting: Family Medicine

## 2019-01-24 ENCOUNTER — Ambulatory Visit (INDEPENDENT_AMBULATORY_CARE_PROVIDER_SITE_OTHER): Payer: No Typology Code available for payment source | Admitting: Family Medicine

## 2019-01-24 VITALS — BP 125/85 | HR 70 | Temp 99.0°F | Ht 68.0 in | Wt 267.0 lb

## 2019-01-24 DIAGNOSIS — Z1151 Encounter for screening for human papillomavirus (HPV): Secondary | ICD-10-CM | POA: Diagnosis present

## 2019-01-24 DIAGNOSIS — R1084 Generalized abdominal pain: Secondary | ICD-10-CM

## 2019-01-24 DIAGNOSIS — Z13220 Encounter for screening for lipoid disorders: Secondary | ICD-10-CM

## 2019-01-24 DIAGNOSIS — Z01411 Encounter for gynecological examination (general) (routine) with abnormal findings: Secondary | ICD-10-CM | POA: Diagnosis not present

## 2019-01-24 DIAGNOSIS — E0789 Other specified disorders of thyroid: Secondary | ICD-10-CM | POA: Diagnosis not present

## 2019-01-24 DIAGNOSIS — K219 Gastro-esophageal reflux disease without esophagitis: Secondary | ICD-10-CM | POA: Diagnosis not present

## 2019-01-24 DIAGNOSIS — Z01419 Encounter for gynecological examination (general) (routine) without abnormal findings: Secondary | ICD-10-CM

## 2019-01-24 LAB — LIPID PANEL

## 2019-01-24 MED ORDER — OMEPRAZOLE 20 MG PO CPDR: 20.0000 mg | DELAYED_RELEASE_CAPSULE | Freq: Every day | ORAL | 3 refills | Status: DC

## 2019-01-24 NOTE — Progress Notes (Signed)
6/17/20203:10 PM  Kathleen Lambert 11/03/1985, 33 y.o., female [id number]  Chief Complaint  Patient presents with  . Annual Exam    having some stomach pain, nausea, diarrhea and constipation. Not taking any meds to for the symptoms    HPI:   Patient is a 33 y.o. female with past medical history significant for depression and PTSD who presents today for CPE with GI concerns  Seeing psych, Dr Lolly MustacheArfeen - started her on pristiq and vistaril Last OV May 2020, patient reports doing well  G&Ps: 0 Pap: July 2017, neg, denies h/o abnormal STD: denies h/o, declines testing as no concerns BC : nexplanon Left arm, placed June 2019 Menses: spotting Mammogram: none FHX breast/ovarian cancer: paternal aunt with breast cancer at 4147 FHx colon cancer: denies Exercise/diet: active job and walks, regular diet [facility]: [facility], wears contacts Dentist: [facility], twice a year  About 6 weeks of cramping abd pain after eating, alternating constipation and diarrhea. + nausea and bloating. Denies any black tarry stools. + reflux. Appetite is ok.  Started taking omeprazole once a day helping with reflux but not other sx Has not noticed any food patterns   Most Recent Immunizations  Administered Date(s) Administered  . Influenza-Unspecified 04/20/2018  . Tdap 03/10/2015    Fall Risk  01/24/2019 10/04/2018 09/08/2018 08/10/2018 05/29/2018  Falls in the past year? 0 0 0 0 No  Number falls in past yr: 0 0 0 - -  Injury with Fall? 0 0 1 - -     [facility] 10/04/2018 09/08/2018 08/10/2018  Decreased Interest 0 2 2  Down, Depressed, Hopeless 0 3 2  PHQ - 2 Score 0 5 4  Altered sleeping 0 2 2  Tired, decreased energy 0 1 2  Change in appetite 1 2 1   Feeling bad or failure about yourself  1 2 2   Trouble concentrating 0 2 3  Moving slowly or fidgety/restless 0 0 0  Suicidal thoughts 1 1 1   PHQ-9 Score 3 15 15   Difficult doing work/chores Somewhat difficult Very difficult Very  difficult  Some recent data might be hidden    Allergies  Allergen Reactions  . Augmentin [Amoxicillin-Pot Clavulanate] Nausea Only    Prior to Admission medications   Medication Sig Start Date End Date Taking? Authorizing Provider  albuterol (PROVENTIL HFA;VENTOLIN HFA) 108 (90 Base) MCG/ACT inhaler Inhale 2 puffs into the lungs every 6 (six) hours as needed for wheezing or shortness of breath. 08/10/18  Yes Myles LippsSantiago, Irma M, MD  desvenlafaxine (PRISTIQ) 100 MG 24 hr tablet Take 1 tablet (100 mg total) by mouth daily. 12/11/18  Yes Arfeen, Phillips GroutSyed T, MD  hydrOXYzine (VISTARIL) 25 MG capsule Take one to two capsule as needed for insomnia and anxiety 11/06/18  Yes Arfeen, Phillips GroutSyed T, MD    Past Medical History:  Diagnosis Date  . Allergy   . Contraceptive management   . Depression   . Frequent headaches   . Meralgia paraesthetica 2019    Past Surgical History:  Procedure Laterality Date  . WISDOM TOOTH EXTRACTION      Social History   Tobacco Use  . Smoking status: Never Smoker  . Smokeless tobacco: Never Used  Substance Use Topics  . Alcohol use: Yes    Alcohol/week: 1.0 standard drinks    Types: 1 Standard drinks or equivalent per week    Comment: social rare    Family History  Problem Relation Age of Onset  . Mental  illness Mother        borderline personality  . Hypertension Father   . Hyperlipidemia Father   . Breast cancer Paternal Aunt   . Lung cancer Paternal Uncle   . Diabetes Maternal Grandmother   . Heart disease Maternal Grandmother   . Hypertension Maternal Grandmother   . Heart disease Maternal Grandfather   . Stroke Paternal Grandfather   . Heart disease Paternal Grandfather     Review of Systems  Constitutional: Negative for chills and fever.  Respiratory: Negative for cough and shortness of breath.   Cardiovascular: Negative for chest pain, palpitations and leg swelling.  Gastrointestinal: Positive for abdominal pain, constipation, diarrhea, heartburn  and nausea. Negative for blood in stool, melena and vomiting.  All other systems reviewed and are negative.  Per hpi  OBJECTIVE:  Today's Vitals   01/24/19 1504  BP: 125/85  Pulse: 70  Temp: 99 F (37.2 C)  TempSrc: Oral  SpO2: 96%  Weight: 267 lb (121.1 kg)  Height: 5\' 8"  (1.727 m)   Body mass index is 40.6 kg/m.  Wt Readings from Last 3 Encounters:  01/24/19 267 lb (121.1 kg)  10/04/18 267 lb 6.4 oz (121.3 kg)  09/17/18 267 lb 3.2 oz (121.2 kg)    Physical Exam Vitals signs and nursing note reviewed. Exam conducted with a chaperone present.  Constitutional:      Appearance: She is well-developed.  HENT:     Head: Normocephalic and atraumatic.     Right Ear: Hearing, tympanic membrane, ear canal and external ear normal.     Left Ear: Hearing, tympanic membrane, ear canal and external ear normal.  Eyes:     Conjunctiva/sclera: Conjunctivae normal.     Pupils: Pupils are equal, round, and reactive to light.  Neck:     Musculoskeletal: Neck supple.     Thyroid: Thyroid mass (left thyroid lobe) present. No thyromegaly or thyroid tenderness.  Cardiovascular:     Rate and Rhythm: Normal rate and regular rhythm.     Heart sounds: Normal heart sounds. No murmur. No friction rub. No gallop.   Pulmonary:     Effort: Pulmonary effort is normal.     Breath sounds: Normal breath sounds. No wheezing, rhonchi or rales.  Chest:     Breasts: Breasts are symmetrical.        Right: No inverted nipple, mass, nipple discharge, skin change or tenderness.        Left: No inverted nipple, mass, nipple discharge, skin change or tenderness.  Abdominal:     General: Bowel sounds are normal. There is no distension.     Palpations: Abdomen is soft. There is no hepatomegaly or splenomegaly.     Tenderness: There is no abdominal tenderness.     Hernia: No hernia is present.  Genitourinary:    Labia:        Right: No rash or lesion.        Left: No rash or lesion.      Vagina: No vaginal  discharge or erythema.     Cervix: No cervical motion tenderness, discharge or friability.     Uterus: Not enlarged, not fixed and not tender.      Adnexa:        Right: No mass or tenderness.         Left: No mass or tenderness.    Musculoskeletal: Normal range of motion.  Lymphadenopathy:     Cervical: No cervical adenopathy.     Upper Body:  Right upper body: No supraclavicular adenopathy.     Left upper body: No supraclavicular adenopathy.  Skin:    General: Skin is warm and dry.  Neurological:     Mental Status: She is alert and oriented to person, place, and time.     Deep Tendon Reflexes: Reflexes are normal and symmetric.    nexplanon palpated Left arm proper location  ASSESSMENT and PLAN  1. Women's annual routine gynecological examination Routine HCM labs ordered. HCM reviewed/discussed. Anticipatory guidance regarding healthy weight, lifestyle and choices given.  - Cytology - PAP  2. Generalized abdominal pain Discussed trial of elimination diets: lactose and gluten to start. Management of constipation. h pylori test to be done 2 weeks off PPI. Consider GI referral. - CBC - Comprehensive metabolic panel - H. pylori breath test; Future  3. Gastroesophageal reflux disease, esophagitis presence not specified Not controlled. Increasing PPI.  - H. pylori breath test; Future  4. Thyroid mass of unclear etiology - TSH - US Soft Tissue Head/Neck; Future  5. Screening for lipid disorders - Lipid panel  Other orders - omeprazole (PRILOSEC) 20 MG capsule; Take 1 capsule (20 mg total) by mouth daily.  Return in about 1 year (around 01/24/2020) for CPE.    Myles LippsIrma M Santiago, MD [facility] [address] [place], [place] [address] Ph.  [phone] Fax [phone]

## 2019-01-24 NOTE — Patient Instructions (Addendum)
   If you have lab work done today you will be contacted with your lab results within the next 2 weeks.  If you have not heard from us then please contact us. The fastest way to get your results is to register for My Chart.   IF you received an x-ray today, you will receive an invoice from [facility]. Please contact [facility] at [phone] with questions or concerns regarding your invoice.   IF you received labwork today, you will receive an invoice from [facility]. Please contact [facility] at [phone] with questions or concerns regarding your invoice.   Our billing staff will not be able to assist you with questions regarding bills from these companies.  You will be contacted with the lab results as soon as they are available. The fastest way to get your results is to activate your My Chart account. Instructions are located on the last page of this paperwork. If you have not heard from us regarding the results in 2 weeks, please contact this office.      Preventive Care 18-39 Years, Female Preventive care refers to lifestyle choices and visits with your health care provider that can promote health and wellness. What does preventive care include?   A yearly physical exam. This is also called an annual well check.  Dental exams once or twice a year.  Routine eye exams. Ask your health care provider how often you should have your eyes checked.  Personal lifestyle choices, including: ? Daily care of your teeth and gums. ? Regular physical activity. ? Eating a healthy diet. ? Avoiding tobacco and drug use. ? Limiting alcohol use. ? Practicing safe sex. ? Taking vitamin and mineral supplements as recommended by your health care provider. What happens during an annual well check? The services and screenings done by your health care provider during your annual well check will depend on your age, overall health, lifestyle risk factors, and family history of  disease. Counseling Your health care provider may ask you questions about your:  Alcohol use.  Tobacco use.  Drug use.  Emotional well-being.  Home and relationship well-being.  Sexual activity.  Eating habits.  Work and work environment.  Method of birth control.  Menstrual cycle.  Pregnancy history. Screening You may have the following tests or measurements:  Height, weight, and BMI.  Diabetes screening. This is done by checking your blood sugar (glucose) after you have not eaten for a while (fasting).  Blood pressure.  Lipid and cholesterol levels. These may be checked every 5 years starting at age 20.  Skin check.  Hepatitis C blood test.  Hepatitis B blood test.  Sexually transmitted disease (STD) testing.  BRCA-related cancer screening. This may be done if you have a family history of breast, ovarian, tubal, or peritoneal cancers.  Pelvic exam and Pap test. This may be done every 3 years starting at age 21. Starting at age 30, this may be done every 5 years if you have a Pap test in combination with an HPV test. Discuss your test results, treatment options, and if necessary, the need for more tests with your health care provider. Vaccines Your health care provider may recommend certain vaccines, such as:  Influenza vaccine. This is recommended every year.  Tetanus, diphtheria, and acellular pertussis (Tdap, Td) vaccine. You may need a Td booster every 10 years.  Varicella vaccine. You may need this if you have not been vaccinated.  HPV vaccine. If you are 26 or younger,   you may need three doses over 6 months.  Measles, mumps, and rubella (MMR) vaccine. You may need at least one dose of MMR. You may also need a second dose.  Pneumococcal 13-valent conjugate (PCV13) vaccine. You may need this if you have certain conditions and were not previously vaccinated.  Pneumococcal polysaccharide (PPSV23) vaccine. You may need one or two doses if you smoke  cigarettes or if you have certain conditions.  Meningococcal vaccine. One dose is recommended if you are age 19-21 years and a first-year college student living in a residence hall, or if you have one of several medical conditions. You may also need additional booster doses.  Hepatitis A vaccine. You may need this if you have certain conditions or if you travel or work in places where you may be exposed to hepatitis A.  Hepatitis B vaccine. You may need this if you have certain conditions or if you travel or work in places where you may be exposed to hepatitis B.  Haemophilus influenzae type b (Hib) vaccine. You may need this if you have certain risk factors. Talk to your health care provider about which screenings and vaccines you need and how often you need them. This information is not intended to replace advice given to you by your health care provider. Make sure you discuss any questions you have with your health care provider. Document Released: 09/21/2001 Document Revised: 03/08/2017 Document Reviewed: 05/27/2015 Elsevier Interactive Patient Education  2019 Elsevier Inc.  

## 2019-01-25 ENCOUNTER — Ambulatory Visit (INDEPENDENT_AMBULATORY_CARE_PROVIDER_SITE_OTHER): Payer: No Typology Code available for payment source | Admitting: Psychology

## 2019-01-25 DIAGNOSIS — F4310 Post-traumatic stress disorder, unspecified: Secondary | ICD-10-CM

## 2019-01-25 LAB — CBC
Hematocrit: 40.6 % (ref 34.0–46.6)
Hemoglobin: 13.6 g/dL (ref 11.1–15.9)
MCH: 29.1 pg (ref 26.6–33.0)
MCHC: 33.5 g/dL (ref 31.5–35.7)
MCV: 87 fL (ref 79–97)
Platelets: 386 10*3/uL (ref 150–450)
RBC: 4.67 x10E6/uL (ref 3.77–5.28)
RDW: 11.9 % (ref 11.7–15.4)
WBC: 6.8 10*3/uL (ref 3.4–10.8)

## 2019-01-25 LAB — COMPREHENSIVE METABOLIC PANEL
ALT: 22 IU/L (ref 0–32)
AST: 16 IU/L (ref 0–40)
Albumin/Globulin Ratio: 1.4 (ref 1.2–2.2)
Albumin: 4.1 g/dL (ref 3.8–4.8)
Alkaline Phosphatase: 87 IU/L (ref 39–117)
BUN/Creatinine Ratio: 22 (ref 9–23)
BUN: 13 mg/dL (ref 6–20)
Bilirubin Total: 0.3 mg/dL (ref 0.0–1.2)
CO2: 20 mmol/L (ref 20–29)
Calcium: 8.9 mg/dL (ref 8.7–10.2)
Chloride: 102 mmol/L (ref 96–106)
Creatinine, Ser: 0.59 mg/dL (ref 0.57–1.00)
GFR calc Af Amer: 141 mL/min/{1.73_m2} (ref >59)
GFR calc non Af Amer: 123 mL/min/{1.73_m2} (ref >59)
Globulin, Total: 2.9 g/dL (ref 1.5–4.5)
Glucose: 87 mg/dL (ref 65–99)
Potassium: 4 mmol/L (ref 3.5–5.2)
Sodium: 136 mmol/L (ref 134–144)
Total Protein: 7 g/dL (ref 6.0–8.5)

## 2019-01-25 LAB — LIPID PANEL
Chol/HDL Ratio: 3.5 ratio (ref 0.0–4.4)
Cholesterol, Total: 175 mg/dL (ref 100–199)
HDL: 50 mg/dL (ref >39)
LDL Calculated: 113 mg/dL — ABNORMAL HIGH (ref 0–99)
Triglycerides: 60 mg/dL (ref 0–149)
VLDL Cholesterol Cal: 12 mg/dL (ref 5–40)

## 2019-01-25 LAB — TSH: TSH: 1.73 u[IU]/mL (ref 0.450–4.500)

## 2019-01-30 LAB — CYTOLOGY - PAP
Diagnosis: NEGATIVE
Diagnosis: REACTIVE
HPV: NOT DETECTED

## 2019-01-31 ENCOUNTER — Ambulatory Visit (INDEPENDENT_AMBULATORY_CARE_PROVIDER_SITE_OTHER): Payer: No Typology Code available for payment source | Admitting: Psychology

## 2019-01-31 DIAGNOSIS — F4310 Post-traumatic stress disorder, unspecified: Secondary | ICD-10-CM | POA: Diagnosis not present

## 2019-01-31 MED FILL — DESVENLAFAXINE SUC ER 100 M: 100 | 30 days supply | Qty: 30 | Fill #1

## 2019-02-02 ENCOUNTER — Ambulatory Visit
Admission: RE | Admit: 2019-02-02 | Discharge: 2019-02-02 | Disposition: A | Discharge status: Home / Self Care | Payer: No Typology Code available for payment source | Source: Ambulatory Visit | Attending: Family Medicine | Admitting: Family Medicine

## 2019-02-02 DIAGNOSIS — E0789 Other specified disorders of thyroid: Secondary | ICD-10-CM

## 2019-02-08 ENCOUNTER — Ambulatory Visit: Payer: No Typology Code available for payment source | Admitting: Psychology

## 2019-02-14 ENCOUNTER — Ambulatory Visit (INDEPENDENT_AMBULATORY_CARE_PROVIDER_SITE_OTHER): Payer: No Typology Code available for payment source | Admitting: Psychiatry

## 2019-02-14 ENCOUNTER — Other Ambulatory Visit: Payer: Self-pay

## 2019-02-14 ENCOUNTER — Encounter (HOSPITAL_COMMUNITY): Payer: Self-pay | Admitting: Psychiatry

## 2019-02-14 DIAGNOSIS — F4310 Post-traumatic stress disorder, unspecified: Secondary | ICD-10-CM

## 2019-02-14 DIAGNOSIS — F330 Major depressive disorder, recurrent, mild: Secondary | ICD-10-CM

## 2019-02-14 MED ORDER — DESVENLAFAXINE SUCCINATE ER 100 MG PO TB24: 100.0000 mg | ORAL_TABLET | Freq: Every day | ORAL | 1 refills | Status: DC

## 2019-02-14 MED ORDER — TRAZODONE HCL 50 MG PO TABS: 50.0000 mg | ORAL_TABLET | Freq: Every day | ORAL | 1 refills | Status: DC

## 2019-02-14 MED FILL — traZODone HCL 50 MG TABS: 50 | 22 days supply | Qty: 45 | Fill #0

## 2019-02-14 NOTE — Progress Notes (Signed)
Virtual Visit via Telephone Note  I connected with Kathleen GoldsAlicia A Zettlemoyer on 02/14/19 at  8:40 AM EDT by telephone and verified that I am speaking with the correct person using two identifiers.   I discussed the limitations, risks, security and privacy concerns of performing an evaluation and management service by telephone and the availability of in person appointments. I also discussed with the patient that there may be a patient responsible charge related to this service. The patient expressed understanding and agreed to proceed.   History of Present Illness: Patient was evaluated through phone session.  She started E MDR however she continues to struggle with nightmares and flashback.  We tried hydroxyzine but it did not work.  Overall she feels Pristiq helping her depression and anxiety.  She has no tremors, shakes or any EPS.  Her job is a still very stressful.  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  She lives by herself.  She denies any major panic attack.  She denies any crying spells or any feeling of hopelessness or worthlessness.  Her energy level is improved.  Her appetite is okay.  She denies any paranoia or any hallucination.  We increase Pristiq 200 mg on her last visit and she is tolerating very well.  She like to try something else to help her sleep.  She lives by herself.  She denies drinking or using any illegal substances.  Recently she had a physical and blood work.  She also complaining of GERD and she was prescribed Prilosec.  Her LDL is mildly elevated.  Her TSH and CBC and chemistry is normal.  Past Psychiatric History: H/O depression and PTSD. Taking antidepressants since January 2019. Tried Lexapro, Wellbutrin, Cymbalta, Abilify and Zoloft. No history of psychiatric inpatient treatment, suicidal attempt, mania, psychosis or any hallucination.  We did gene site testing and results reviewed.   Recent Results (from the past 2160 hour(s))  Cytology - PAP      Status: None   Collection Time: 01/24/19 12:00 AM  Result Value Ref Range   Adequacy      Satisfactory for evaluation  endocervical/transformation zone component PRESENT.   Diagnosis      -  NEGATIVE FOR INTRAEPITHELIAL LESIONS OR MALIGNANCY   Diagnosis -  BENIGN REACTIVE/REPARATIVE CHANGES    HPV NOT DETECTED     Comment: Normal Reference Range - NOT Detected   Material Submitted CervicoVaginal Pap [ThinPrep Imaged]   CBC     Status: None   Collection Time: 01/24/19  3:52 PM  Result Value Ref Range   WBC 6.8 3.4 - 10.8 x10E3/uL   RBC 4.67 3.77 - 5.28 x10E6/uL   Hemoglobin 13.6 11.1 - 15.9 g/dL   Hematocrit 19.140.6 47.834.0 - 46.6 %   MCV 87 79 - 97 fL   MCH 29.1 26.6 - 33.0 pg   MCHC 33.5 31.5 - 35.7 g/dL   RDW 29.511.9 62.111.7 - 30.815.4 %   Platelets 386 150 - 450 x10E3/uL  Comprehensive metabolic panel     Status: None   Collection Time: 01/24/19  3:52 PM  Result Value Ref Range   Glucose 87 65 - 99 mg/dL   BUN 13 6 - 20 mg/dL   Creatinine, Ser 6.570.59 0.57 - 1.00 mg/dL   GFR calc non Af Amer 123 >59 mL/min/1.73   GFR calc Af Amer 141 >59 mL/min/1.73   BUN/Creatinine Ratio 22 9 - 23   Sodium 136 134 - 144 mmol/L   Potassium 4.0  3.5 - 5.2 mmol/L   Chloride 102 96 - 106 mmol/L   CO2 20 20 - 29 mmol/L   Calcium 8.9 8.7 - 10.2 mg/dL   Total Protein 7.0 6.0 - 8.5 g/dL   Albumin 4.1 3.8 - 4.8 g/dL   Globulin, Total 2.9 1.5 - 4.5 g/dL   Albumin/Globulin Ratio 1.4 1.2 - 2.2   Bilirubin Total 0.3 0.0 - 1.2 mg/dL   Alkaline Phosphatase 87 39 - 117 IU/L   AST 16 0 - 40 IU/L   ALT 22 0 - 32 IU/L  TSH     Status: None   Collection Time: 01/24/19  3:52 PM  Result Value Ref Range   TSH 1.730 0.450 - 4.500 uIU/mL  Lipid panel     Status: Abnormal   Collection Time: 01/24/19  3:52 PM  Result Value Ref Range   Cholesterol, Total 175 100 - 199 mg/dL   Triglycerides 60 0 - 149 mg/dL   HDL 50 >39 mg/dL   VLDL Cholesterol Cal 12 5 - 40 mg/dL   LDL Calculated 113 (H) 0 - 99 mg/dL   Chol/HDL Ratio  3.5 0.0 - 4.4 ratio    Comment:                                   T. Chol/HDL Ratio                                             Men  Women                               1/2 Avg.Risk  3.4    3.3                                   Avg.Risk  5.0    4.4                                2X Avg.Risk  9.6    7.1                                3X Avg.Risk 23.4   11.0       Psychiatric Specialty Exam: Physical Exam  ROS  There were no vitals taken for this visit.There is no height or weight on file to calculate BMI.  General Appearance: NA  Eye Contact:  NA  Speech:  Clear and Coherent and Normal Rate  Volume:  Decreased  Mood:  Anxious  Affect:  NA  Thought Process:  Goal Directed  Orientation:  Full (Time, Place, and Person)  Thought Content:  Rumination  Suicidal Thoughts:  No  Homicidal Thoughts:  No  Memory:  Immediate;   Good Recent;   Good Remote;   Good  Judgement:  Good  Insight:  Good  Psychomotor Activity:  NA  Concentration:  Concentration: Good and Attention Span: Good  Recall:  Good  Fund of Knowledge:  Good  Language:  Good  Akathisia:  No  Handed:  Right  AIMS (if indicated):  Assets:  Communication Skills Desire for Improvement Housing Talents/Skills Transportation  ADL's:  Intact  Cognition:  WNL  Sleep:   fair, night mare      Assessment and Plan: Major depressive disorder, recurrent.  Posttraumatic stress disorder.  Patient doing better on Pristiq 100 mg.  She has no side effects.  I will discontinue hydroxyzine and try trazodone 50-100 mg at bedtime to help insomnia and nightmares.  Discussed medication side effects and benefits.  Encouraged to continue EMDR with Cameron AliBomby Cottle.  Recommended to call us back if she has any question or any concern.  Follow-up in 2 months.  Follow Up Instructions:    I discussed the assessment and treatment plan with the patient. The patient was provided an opportunity to ask questions and all were answered. The  patient agreed with the plan and demonstrated an understanding of the instructions.   The patient was advised to call back or seek an in-person evaluation if the symptoms worsen or if the condition fails to improve as anticipated.  I provided 20 minutes of non-face-to-face time during this encounter.   Cleotis NipperSyed T Becky Colan, MD

## 2019-02-15 ENCOUNTER — Ambulatory Visit (INDEPENDENT_AMBULATORY_CARE_PROVIDER_SITE_OTHER): Payer: No Typology Code available for payment source | Admitting: Psychology

## 2019-02-15 DIAGNOSIS — F4310 Post-traumatic stress disorder, unspecified: Secondary | ICD-10-CM | POA: Diagnosis not present

## 2019-02-23 ENCOUNTER — Ambulatory Visit (INDEPENDENT_AMBULATORY_CARE_PROVIDER_SITE_OTHER): Payer: No Typology Code available for payment source | Admitting: Psychology

## 2019-02-23 DIAGNOSIS — F4310 Post-traumatic stress disorder, unspecified: Secondary | ICD-10-CM | POA: Diagnosis not present

## 2019-02-23 MED FILL — OMEPRAZOLE 20 MG CPDR: 20 | 30 days supply | Qty: 30 | Fill #0

## 2019-02-24 MED FILL — DESVENLAFAXINE SUC ER 100 M: 100 | 30 days supply | Qty: 30 | Fill #0

## 2019-02-26 ENCOUNTER — Encounter: Payer: Self-pay | Admitting: Family Medicine

## 2019-03-02 ENCOUNTER — Ambulatory Visit (INDEPENDENT_AMBULATORY_CARE_PROVIDER_SITE_OTHER): Payer: No Typology Code available for payment source | Admitting: Psychology

## 2019-03-02 DIAGNOSIS — F4310 Post-traumatic stress disorder, unspecified: Secondary | ICD-10-CM

## 2019-03-13 ENCOUNTER — Telehealth: Payer: No Typology Code available for payment source | Admitting: Family

## 2019-03-13 DIAGNOSIS — T753XXA Motion sickness, initial encounter: Secondary | ICD-10-CM

## 2019-03-13 MED ORDER — MECLIZINE HCL 25 MG PO TABS: 25.0000 mg | ORAL_TABLET | Freq: Three times a day (TID) | ORAL | 0 refills | Status: DC | PRN

## 2019-03-13 NOTE — Progress Notes (Signed)
E Visit for Motion Sickness  We are sorry that you are not feeling well. Here is how we plan to help!  Based on what you have shared with me it looks like you have symptoms of motion sickness.  I have prescribed a medication that will help prevent or alleviate your symptoms:  Meclizine 25mg by mouth three times per day as needed for nausea/motion sickness   Prevention:  You might feel better if you keep your eyes focused on outside while you are in motion. For example, if you are in a car, sit in the front and look in the direction you are moving; if you are on a boat, stay on the deck and look to the horizon. This helps make what you see match the movement you are feeling, and so you are less likely to feel sick.  You should also avoid reading, watching a movie, texting or reading messages, or looking at things close to you inside the vehicle you are riding in.  . Use the seat head rest. Lean your head against the back of the seat or head rest when traveling in vehicles with seats to minimize head movements.  . On a ship: When making your reservations, choose a cabin in the middle of the ship and near the waterline. When on board, go up on deck and focus on the horizon.  . In an airplane: Request a window seat and look out the window. A seat over the front edge of the wing is the most preferable spot (the degree of motion is the lowest here). Direct the air vent to blow cool air on your face.  . On a train: Always face forward and sit near a window.  . In a vehicle: Sit in the front seat; if you are the passenger, look at the scenery in the distance. For some people, driving the vehicle (rather than being a passenger) is an instant remedy.  . Avoid others who have become nauseous with motion sickness. Seeing and smelling others who have motion sickness may cause you to become sick.  GET HELP RIGHT AWAY IF:   Your symptoms do not improve or worsen within 2 days after  treatment.   You cannot keep down fluids after trying the medication.   Other associated symptoms such as severe headache, visual field changes, fever, or intractable nausea and vomiting.  MAKE SURE YOU:   Understand these instructions.  Will watch your condition.  Will get help right away if you are not doing well or get worse.  Thank you for choosing an e-visit.  Your e-visit answers were reviewed by a board certified advanced clinical practitioner to complete your personal care plan. Depending upon the condition, your plan could have included both over the counter or prescription medications.  Please review your pharmacy choice. Be sure that the pharmacy you have chosen is open so that you can pick up your prescription now.  If there is a problem you may message your provider in MyChart to have the prescription routed to another pharmacy.  Your safety is important to us. If you have drug allergies check your prescription carefully.   For the next 24 hours, you can use MyChart to ask questions about today's visit, request a non-urgent call back, or ask for a work or school excuse from your e-visit provider.  You will get an e-mail in the next two days asking about your experience. I hope that your e-visit has been valuable and will speed   your recovery.   References or for more information: https://wwwnc.cdc.gov/travel/yellowbook/2020/travel-by-air-land-sea/motion-sickness https://my.[facility].org/health/articles/12782-motion-sickness https://www.uptodate.com  Greater than 5 minutes, yet less than 10 minutes of time have been spent researching, coordinating, and implementing care for this patient today.  Thank you for the details you included in the comment boxes. Those details are very helpful in determining the best course of treatment for you and help us to provide the best care.  

## 2019-03-15 ENCOUNTER — Ambulatory Visit (HOSPITAL_COMMUNITY)
Admission: EM | Admit: 2019-03-15 | Discharge: 2019-03-15 | Disposition: A | Discharge status: Home / Self Care | Payer: No Typology Code available for payment source | Attending: Emergency Medicine | Admitting: Emergency Medicine

## 2019-03-15 ENCOUNTER — Encounter (HOSPITAL_COMMUNITY): Payer: Self-pay

## 2019-03-15 ENCOUNTER — Ambulatory Visit (INDEPENDENT_AMBULATORY_CARE_PROVIDER_SITE_OTHER): Payer: No Typology Code available for payment source | Admitting: Psychology

## 2019-03-15 ENCOUNTER — Other Ambulatory Visit: Payer: Self-pay

## 2019-03-15 DIAGNOSIS — F4310 Post-traumatic stress disorder, unspecified: Secondary | ICD-10-CM

## 2019-03-15 DIAGNOSIS — R1013 Epigastric pain: Secondary | ICD-10-CM | POA: Diagnosis present

## 2019-03-15 DIAGNOSIS — R112 Nausea with vomiting, unspecified: Secondary | ICD-10-CM | POA: Insufficient documentation

## 2019-03-15 LAB — CBC
HCT: 42 % (ref 36.0–46.0)
Hemoglobin: 14.1 g/dL (ref 12.0–15.0)
MCH: 29.7 pg (ref 26.0–34.0)
MCHC: 33.6 g/dL (ref 30.0–36.0)
MCV: 88.4 fL (ref 80.0–100.0)
Platelets: 380 10*3/uL (ref 150–400)
RBC: 4.75 MIL/uL (ref 3.87–5.11)
RDW: 11.9 % (ref 11.5–15.5)
WBC: 5.3 10*3/uL (ref 4.0–10.5)
nRBC: 0 % (ref 0.0–0.2)

## 2019-03-15 LAB — COMPREHENSIVE METABOLIC PANEL
ALT: 24 U/L (ref 0–44)
AST: 19 U/L (ref 15–41)
Albumin: 4.1 g/dL (ref 3.5–5.0)
Alkaline Phosphatase: 94 U/L (ref 38–126)
Anion gap: 12 (ref 5–15)
BUN: 12 mg/dL (ref 6–20)
CO2: 23 mmol/L (ref 22–32)
Calcium: 9.2 mg/dL (ref 8.9–10.3)
Chloride: 101 mmol/L (ref 98–111)
Creatinine, Ser: 0.71 mg/dL (ref 0.44–1.00)
GFR calc Af Amer: >60 mL/min (ref >60)
GFR calc non Af Amer: >60 mL/min (ref >60)
Glucose, Bld: 77 mg/dL (ref 70–99)
Potassium: 3.4 mmol/L — ABNORMAL LOW (ref 3.5–5.1)
Sodium: 136 mmol/L (ref 135–145)
Total Bilirubin: 1 mg/dL (ref 0.3–1.2)
Total Protein: 8 g/dL (ref 6.5–8.1)

## 2019-03-15 LAB — LIPASE, BLOOD: Lipase: 23 U/L (ref 11–51)

## 2019-03-15 MED ORDER — ONDANSETRON 4 MG PO TBDP: 4.0000 mg | ORAL_TABLET | Freq: Three times a day (TID) | ORAL | 0 refills | Status: DC | PRN

## 2019-03-15 MED FILL — ONDANSETRON ODT 4 MG TABLET: 4 | 4 days supply | Qty: 12 | Fill #0

## 2019-03-15 NOTE — Discharge Instructions (Signed)
°  Be sure to get a lot of rest and stay well hydrated with sports drinks, water, diluted juices, and clear sodas.  Avoid fried fatty food, spicy food, and milk as these foods can cause worsening stomach upset.   Be sure to follow up with your family doctor next week and discuss today's visit and labs.  If any lab work comes back abnormal, you will be notified in about 24-48 hours.  Call 911 or go to the hospital if symptoms significantly worsen or new concerning symptoms develop- severe abdominal pain, unable to keep down fluids, dizziness/passing out.

## 2019-03-15 NOTE — ED Triage Notes (Signed)
Pt states she has abdominal pain and vomiting x 2 weeks. Pt has made her PCP aware of this issue and she can't see them until next week.Marland Kitchen

## 2019-03-15 NOTE — ED Provider Notes (Signed)
[facility]    CSN: [id number] Arrival date & time: 03/15/19  1304     History   Chief Complaint Chief Complaint  Patient presents with  . Abdominal Pain  . Emesis    HPI Kathleen Lambert is a 33 y.o. female.   HPI  Kathleen Lambert is a 33 y.o. female presenting to UC with c/o 2 weeks of nausea and vomiting after eating. Pt states she was advised by her PCP she needs to be tested for H. Pylori so she was taken off her PPI in preparation for that test but has had nausea and vomiting since being off the medication. Associated epigastric pain after eating or drinking.  She tried apple sauce yesterday and vomited back up. She is able to keep down fluids.  She attempted an Evisit with her PCP 2 days ago but was not able to select just nausea and vomiting, she could only select Motion Sickness. She was prescribed meclizine but states it has not helped. She would like to try Zofran to hopefully hold her over until her f/u with her PCP in person next week.  Denies fever, chills, or diarrhea.  Denies urinary symptoms. No hx of pancreatitis or gallbladder issues.    Past Medical History:  Diagnosis Date  . Allergy   . Contraceptive management   . Depression   . Frequent headaches   . Meralgia paraesthetica 2019    Patient Active Problem List   Diagnosis Date Noted  . Excessive sweating, local 11/29/2017  . Anxiety and depression 08/29/2017    Past Surgical History:  Procedure Laterality Date  . WISDOM TOOTH EXTRACTION      OB History   No obstetric history on file.      Home Medications    Prior to Admission medications   Medication Sig Start Date End Date Taking? Authorizing Provider  albuterol (PROVENTIL HFA;VENTOLIN HFA) 108 (90 Base) MCG/ACT inhaler Inhale 2 puffs into the lungs every 6 (six) hours as needed for wheezing or shortness of breath. 08/10/18   Rutherford Guys, MD  desvenlafaxine (PRISTIQ) 100 MG 24 hr tablet Take 1 tablet (100 mg total) by mouth  daily. 02/14/19   Arfeen, Arlyce Harman, MD  meclizine (ANTIVERT) 25 MG tablet Take 1 tablet (25 mg total) by mouth 3 (three) times daily as needed for dizziness. Patient not taking: Reported on 03/15/2019 03/13/19   Kennyth Arnold, FNP  omeprazole (PRILOSEC) 20 MG capsule Take 1 capsule (20 mg total) by mouth daily. Patient not taking: Reported on 03/15/2019 01/24/19   Rutherford Guys, MD  ondansetron (ZOFRAN ODT) 4 MG disintegrating tablet Take 1 tablet (4 mg total) by mouth every 8 (eight) hours as needed for nausea or vomiting. 03/15/19   Noe Gens, PA-C  traZODone (DESYREL) 50 MG tablet Take 1-2 tablets (50-100 mg total) by mouth at bedtime. Patient not taking: Reported on 03/15/2019 02/14/19   Kathlee Nations, MD    Family History Family History  Problem Relation Age of Onset  . Mental illness Mother        borderline personality  . Hypertension Father   . Hyperlipidemia Father   . Breast cancer Paternal Aunt   . Lung cancer Paternal Uncle   . Diabetes Maternal Grandmother   . Heart disease Maternal Grandmother   . Hypertension Maternal Grandmother   . Heart disease Maternal Grandfather   . Stroke Paternal Grandfather   . Heart disease Paternal Grandfather  Social History Social History   Tobacco Use  . Smoking status: Never Smoker  . Smokeless tobacco: Never Used  Substance Use Topics  . Alcohol use: Yes    Alcohol/week: 1.0 standard drinks    Types: 1 Standard drinks or equivalent per week    Comment: social rare  . Drug use: No     Allergies   Augmentin [amoxicillin-pot clavulanate]   Review of Systems Review of Systems  Constitutional: Negative for chills and fever.  HENT: Negative for congestion, ear pain, sore throat, trouble swallowing and voice change.   Respiratory: Negative for cough and shortness of breath.   Cardiovascular: Negative for chest pain and palpitations.  Gastrointestinal: Positive for abdominal pain (epigastric), nausea and vomiting. Negative for  diarrhea.  Genitourinary: Negative for dysuria, flank pain and frequency.  Musculoskeletal: Negative for arthralgias, back pain and myalgias.  Skin: Negative for rash.  Neurological: Negative for dizziness, syncope, light-headedness and headaches.     Physical Exam Triage Vital Signs ED Triage Vitals  Enc Vitals Group     BP 03/15/19 1410 125/83     Pulse Rate 03/15/19 1410 71     Resp 03/15/19 1410 18     Temp 03/15/19 1410 98.3 F (36.8 C)     Temp Source 03/15/19 1410 Oral     SpO2 03/15/19 1410 97 %     Weight 03/15/19 1408 265 lb (120.2 kg)     Height --      Head Circumference --      Peak Flow --      Pain Score 03/15/19 1408 3     Pain Loc --      Pain Edu? --      Excl. in GC? --    No data found.  Updated Vital Signs BP 125/83 (BP Location: Right Arm)   Pulse 71   Temp 98.3 F (36.8 C) (Oral)   Resp 18   Wt 265 lb (120.2 kg)   SpO2 97%   BMI 40.29 kg/m   Visual Acuity Right Eye Distance:   Left Eye Distance:   Bilateral Distance:    Right Eye Near:   Left Eye Near:    Bilateral Near:     Physical Exam Vitals signs and nursing note reviewed.  Constitutional:      General: She is not in acute distress.    Appearance: She is well-developed. She is obese. She is not ill-appearing.  HENT:     Head: Normocephalic and atraumatic.  Neck:     Musculoskeletal: Normal range of motion.  Cardiovascular:     Rate and Rhythm: Normal rate and regular rhythm.  Pulmonary:     Effort: Pulmonary effort is normal.     Breath sounds: Normal breath sounds.  Abdominal:     General: There is no distension.     Palpations: Abdomen is soft.     Tenderness: There is no abdominal tenderness. There is no right CVA tenderness or left CVA tenderness.  Musculoskeletal: Normal range of motion.  Skin:    General: Skin is warm and dry.  Neurological:     Mental Status: She is alert and oriented to person, place, and time.  Psychiatric:        Behavior: Behavior normal.       UC Treatments / Results  Labs (all labs ordered are listed, but only abnormal results are displayed) Labs Reviewed  CBC  COMPREHENSIVE METABOLIC PANEL  LIPASE, BLOOD    EKG  Radiology No results found.  Procedures Procedures (including critical care time)  Medications Ordered in UC Medications - No data to display  Initial Impression / Assessment and Plan / UC Course  I have reviewed the triage vital signs and the nursing notes.  Pertinent labs & imaging results that were available during my care of the patient were reviewed by me and considered in my medical decision making (see chart for details).     Benign abdominal exam Will tx with zofran Labs pending F/u with PCP next week as previously scheduled.  AVS provided Discussed symptoms that warrant emergent care in the ED.  Final Clinical Impressions(s) / UC Diagnoses   Final diagnoses:  Nausea and vomiting in adult patient  Abdominal pain, epigastric     Discharge Instructions      Be sure to get a lot of rest and stay well hydrated with sports drinks, water, diluted juices, and clear sodas.  Avoid fried fatty food, spicy food, and milk as these foods can cause worsening stomach upset.   Be sure to follow up with your family doctor next week and discuss today's visit and labs.  If any lab work comes back abnormal, you will be notified in about 24-48 hours.  Call 911 or go to the hospital if symptoms significantly worsen or new concerning symptoms develop- severe abdominal pain, unable to keep down fluids, dizziness/passing out.     ED Prescriptions    Medication Sig Dispense Auth. Provider   ondansetron (ZOFRAN ODT) 4 MG disintegrating tablet Take 1 tablet (4 mg total) by mouth every 8 (eight) hours as needed for nausea or vomiting. 12 tablet Lurene ShadowPhelps, Alycen Mack O, PA-C     Controlled Substance Prescriptions [place] Controlled Substance Registry consulted? Not Applicable   Rolla Platehelps, Marsh Heckler O, PA-C  03/15/19 1535

## 2019-03-16 ENCOUNTER — Telehealth (HOSPITAL_COMMUNITY): Payer: Self-pay | Admitting: Emergency Medicine

## 2019-03-16 NOTE — Telephone Encounter (Signed)
Normal. Attempted to reach patient. No answer at this time.   

## 2019-03-22 ENCOUNTER — Other Ambulatory Visit: Payer: Self-pay

## 2019-03-22 ENCOUNTER — Telehealth (INDEPENDENT_AMBULATORY_CARE_PROVIDER_SITE_OTHER): Payer: No Typology Code available for payment source | Admitting: Family Medicine

## 2019-03-22 DIAGNOSIS — N926 Irregular menstruation, unspecified: Secondary | ICD-10-CM | POA: Diagnosis not present

## 2019-03-22 DIAGNOSIS — K219 Gastro-esophageal reflux disease without esophagitis: Secondary | ICD-10-CM | POA: Diagnosis not present

## 2019-03-22 DIAGNOSIS — Z975 Presence of (intrauterine) contraceptive device: Secondary | ICD-10-CM | POA: Diagnosis not present

## 2019-03-22 MED ORDER — OMEPRAZOLE 20 MG PO CPDR: 20.0000 mg | DELAYED_RELEASE_CAPSULE | Freq: Two times a day (BID) | ORAL | 3 refills | Status: DC

## 2019-03-22 NOTE — Progress Notes (Signed)
Virtual Visit Note  I connected with patient on 03/22/19 at 540pm by phone and verified that I am speaking with the correct person using two identifiers. Kathleen Lambert is currently located at home and patient is currently with them during visit. The provider, Myles LippsIrma M Santiago, MD is located in their office at time of visit.  I discussed the limitations, risks, security and privacy concerns of performing an evaluation and management service by telephone and the availability of in person appointments. I also discussed with the patient that there may be a patient responsible charge related to this service. The patient expressed understanding and agreed to proceed.   CC: heavy bleeding  HPI  Reports a month long period in June 2020 Has resolved since then Tried high dose ibuprofen but afraid about her stomach? nexplanon placed in 2019 Normal pap and TSH in June 2020 Normal CBC in July 2020 If she were to change method she would like to try an IUD G0  She is also having epigastric abd pain after eating or drinking since before June 2020 At that visit discussed elimination diet  She tried eliminating gluten and lactose wo improvement She tried stopping her omeprazole so that she could be tested for h pylori but then she started having intense  Vomiting, no hematemesis or melena Has constipation, denies any diarrhea Has never had EGD      Allergies  Allergen Reactions  . Augmentin [Amoxicillin-Pot Clavulanate] Nausea Only    Prior to Admission medications   Medication Sig Start Date End Date Taking? Authorizing Provider  albuterol (PROVENTIL HFA;VENTOLIN HFA) 108 (90 Base) MCG/ACT inhaler Inhale 2 puffs into the lungs every 6 (six) hours as needed for wheezing or shortness of breath. 08/10/18   Myles LippsSantiago, Salih Williamson M, MD  desvenlafaxine (PRISTIQ) 100 MG 24 hr tablet Take 1 tablet (100 mg total) by mouth daily. 02/14/19   Arfeen, Phillips GroutSyed T, MD  meclizine (ANTIVERT) 25 MG tablet Take 1 tablet  (25 mg total) by mouth 3 (three) times daily as needed for dizziness. Patient not taking: Reported on 03/15/2019 03/13/19   Eulis FosterWebb, Padonda B, FNP  omeprazole (PRILOSEC) 20 MG capsule Take 1 capsule (20 mg total) by mouth daily. Patient not taking: Reported on 03/15/2019 01/24/19   Myles LippsSantiago, Raygen Linquist M, MD  ondansetron (ZOFRAN ODT) 4 MG disintegrating tablet Take 1 tablet (4 mg total) by mouth every 8 (eight) hours as needed for nausea or vomiting. 03/15/19   Lurene ShadowPhelps, Erin O, PA-C  traZODone (DESYREL) 50 MG tablet Take 1-2 tablets (50-100 mg total) by mouth at bedtime. Patient not taking: Reported on 03/15/2019 02/14/19   Cleotis NipperArfeen, Syed T, MD    Past Medical History:  Diagnosis Date  . Allergy   . Contraceptive management   . Depression   . Frequent headaches   . Meralgia paraesthetica 2019    Past Surgical History:  Procedure Laterality Date  . WISDOM TOOTH EXTRACTION      Social History   Tobacco Use  . Smoking status: Never Smoker  . Smokeless tobacco: Never Used  Substance Use Topics  . Alcohol use: Yes    Alcohol/week: 1.0 standard drinks    Types: 1 Standard drinks or equivalent per week    Comment: social rare    Family History  Problem Relation Age of Onset  . Mental illness Mother        borderline personality  . Hypertension Father   . Hyperlipidemia Father   . Breast cancer Paternal Aunt   .  Lung cancer Paternal Uncle   . Diabetes Maternal Grandmother   . Heart disease Maternal Grandmother   . Hypertension Maternal Grandmother   . Heart disease Maternal Grandfather   . Stroke Paternal Grandfather   . Heart disease Paternal Grandfather     ROS Per hpi  Objective  Vitals as reported by the patient: none   ASSESSMENT and PLAN  1. Irregular menses 2. Nexplanon in place Resolved. 2/2/ nexplanon. Concern for nsaid use given current GI sx. COC if recurrs. Consider removal, would refer to gyn as would want to change to IUD.  3. Gastroesophageal reflux disease, esophagitis  presence not specified Increase PPI to BID dosing. Referring to GI for possible EGD given recent severity of symptoms.  - Ambulatory referral to Gastroenterology  Other orders - omeprazole (PRILOSEC) 20 MG capsule; Take 1 capsule (20 mg total) by mouth 2 (two) times daily before a meal.  FOLLOW-UP: prn   The above assessment and management plan was discussed with the patient. The patient verbalized understanding of and has agreed to the management plan. Patient is aware to call the clinic if symptoms persist or worsen. Patient is aware when to return to the clinic for a follow-up visit. Patient educated on when it is appropriate to go to the emergency department.    I provided 15 minutes of non-face-to-face time during this encounter.  Rutherford Guys, MD [facility] [place], [place] [address] Ph.  [phone] Fax [phone]

## 2019-03-22 NOTE — Progress Notes (Signed)
Pt is wanting to talk about the nexplanon that she had placed by [facility] 02/2018. She says she will go months without a period and then bleed for days. Last period was 40 days ending 02/28/19. This is  causing her to be very fatigue. She is also still having the abdomen cramps. She change her diet as you suggested, that did not work with the cramps.

## 2019-03-23 ENCOUNTER — Encounter: Payer: Self-pay | Admitting: Internal Medicine

## 2019-03-23 MED FILL — OMEPRAZOLE 20 MG CAPSULE DR: 20 | 30 days supply | Qty: 60 | Fill #0

## 2019-03-28 ENCOUNTER — Ambulatory Visit (INDEPENDENT_AMBULATORY_CARE_PROVIDER_SITE_OTHER): Payer: No Typology Code available for payment source | Admitting: Psychology

## 2019-03-28 DIAGNOSIS — F4310 Post-traumatic stress disorder, unspecified: Secondary | ICD-10-CM

## 2019-03-30 ENCOUNTER — Encounter: Payer: Self-pay | Admitting: Family Medicine

## 2019-04-02 ENCOUNTER — Ambulatory Visit (INDEPENDENT_AMBULATORY_CARE_PROVIDER_SITE_OTHER): Payer: No Typology Code available for payment source | Admitting: Psychology

## 2019-04-02 DIAGNOSIS — F4323 Adjustment disorder with mixed anxiety and depressed mood: Secondary | ICD-10-CM | POA: Diagnosis not present

## 2019-04-02 MED ORDER — ONDANSETRON 4 MG PO TBDP: 4.0000 mg | ORAL_TABLET | Freq: Three times a day (TID) | ORAL | 0 refills | Status: DC | PRN

## 2019-04-02 MED FILL — OMEPRAZOLE 20 MG CAPSULE DR: 20 | 30 days supply | Qty: 60 | Fill #0

## 2019-04-02 MED FILL — ONDANSETRON ODT 4 MG TABLET: 4 | 4 days supply | Qty: 12 | Fill #0

## 2019-04-09 MED FILL — DESVENLAFAXINE SUC ER 100 M: 100 | 30 days supply | Qty: 30 | Fill #0

## 2019-04-11 ENCOUNTER — Ambulatory Visit (INDEPENDENT_AMBULATORY_CARE_PROVIDER_SITE_OTHER): Payer: No Typology Code available for payment source | Admitting: Psychology

## 2019-04-11 DIAGNOSIS — F4310 Post-traumatic stress disorder, unspecified: Secondary | ICD-10-CM | POA: Diagnosis not present

## 2019-04-17 ENCOUNTER — Ambulatory Visit (INDEPENDENT_AMBULATORY_CARE_PROVIDER_SITE_OTHER): Payer: No Typology Code available for payment source | Admitting: Psychiatry

## 2019-04-17 ENCOUNTER — Encounter (HOSPITAL_COMMUNITY): Payer: Self-pay | Admitting: Psychiatry

## 2019-04-17 ENCOUNTER — Other Ambulatory Visit: Payer: Self-pay

## 2019-04-17 DIAGNOSIS — F4310 Post-traumatic stress disorder, unspecified: Secondary | ICD-10-CM | POA: Diagnosis not present

## 2019-04-17 DIAGNOSIS — F330 Major depressive disorder, recurrent, mild: Secondary | ICD-10-CM

## 2019-04-17 MED ORDER — DESVENLAFAXINE SUCCINATE ER 100 MG PO TB24: 100.0000 mg | ORAL_TABLET | Freq: Every day | ORAL | 2 refills | Status: DC

## 2019-04-17 MED ORDER — TRAZODONE HCL 50 MG PO TABS: 50.0000 mg | ORAL_TABLET | Freq: Every day | ORAL | 2 refills | Status: DC

## 2019-04-17 MED FILL — traZODone HCL 50 MG TABS: 50 | 30 days supply | Qty: 30 | Fill #0

## 2019-04-17 NOTE — Progress Notes (Signed)
Virtual Visit via Telephone Note  I connected with Kathleen Lambert on [birth year] at  1:20 PM EDT by telephone and verified that I am speaking with the correct person using two identifiers.   I discussed the limitations, risks, security and privacy concerns of performing an evaluation and management service by telephone and the availability of in person appointments. I also discussed with the patient that there may be a patient responsible charge related to this service. The patient expressed understanding and agreed to proceed.   History of Present Illness: Patient was evaluated through phone session.  She is doing much better with trazodone.  She is taking 50 mg which is helping her sleep and her nightmares are less intense and less frequent.  She is getting therapy for EMDR with Morene Crocker.  She feels her anxiety is somewhat better even though the job is very stressful.  She works as a Therapist, sports at [employer].  She lives by herself.  She denies any crying spells or any feeling of hopelessness or worthlessness.  She is compliant with Pristiq which is helping her depression and anxiety.  She denies drinking or using any illegal substances.  Her appetite is okay.  Energy level is good.  She reported her weight is a stable.  She has no tremors shakes or any EPS.  Past Psychiatric History: H/Odepression andPTSD.Takingantidepressants since January 2019. Tried Lexapro, Wellbutrin, Cymbalta, Abilify and Zoloft. Recently hydroxyzine with poor response.  No history of psychiatric inpatient treatment, suicidal attempt, mania, psychosis or any hallucination.We did gene site testing and results reviewed.  Psychiatric Specialty Exam: Physical Exam  ROS  There were no vitals taken for this visit.There is no height or weight on file to calculate BMI.  General Appearance: NA  Eye Contact:  NA  Speech:  Clear and Coherent and Normal Rate  Volume:  Normal  Mood:  Euthymic  Affect:  NA   Thought Process:  Goal Directed  Orientation:  Full (Time, Place, and Person)  Thought Content:  WDL  Suicidal Thoughts:  No  Homicidal Thoughts:  No  Memory:  Immediate;   Good Recent;   Good Remote;   Good  Judgement:  Good  Insight:  Good  Psychomotor Activity:  NA  Concentration:  Concentration: Good and Attention Span: Good  Recall:  Good  Fund of Knowledge:  Good  Language:  Good  Akathisia:  No  Handed:  Right  AIMS (if indicated):     Assets:  Communication Skills Desire for Improvement Housing Resilience Social Support Talents/Skills Transportation  ADL's:  Intact  Cognition:  WNL  Sleep:   improved     Assessment and Plan: Major depressive disorder, recurrent.  Posttraumatic stress disorder.  Patient is doing better on her trazodone.  She is sleeping good.  Continue Pristiq 100 mg daily and trazodone 50 mg at bedtime for insomnia.  Encouraged to continue therapy with Caroline Sauger for EMDR.  Recommended to call us back if she has any question or any concern.  Follow-up in 3 months.  Follow Up Instructions:    I discussed the assessment and treatment plan with the patient. The patient was provided an opportunity to ask questions and all were answered. The patient agreed with the plan and demonstrated an understanding of the instructions.   The patient was advised to call back or seek an in-person evaluation if the symptoms worsen or if the condition fails to improve as anticipated.  I provided 20 minutes of non-face-to-face time  during this encounter.   Kathlee Nations, MD

## 2019-04-23 ENCOUNTER — Other Ambulatory Visit: Payer: Self-pay

## 2019-04-23 ENCOUNTER — Encounter: Payer: Self-pay | Admitting: Internal Medicine

## 2019-04-23 ENCOUNTER — Ambulatory Visit: Payer: No Typology Code available for payment source | Admitting: Internal Medicine

## 2019-04-23 VITALS — BP 124/70 | HR 96 | Temp 98.4°F | Ht 67.5 in | Wt 263.4 lb

## 2019-04-23 DIAGNOSIS — K219 Gastro-esophageal reflux disease without esophagitis: Secondary | ICD-10-CM | POA: Diagnosis not present

## 2019-04-23 NOTE — Patient Instructions (Signed)
You have been scheduled for an endoscopy. Please follow written instructions given to you at your visit today. If you use inhalers (even only as needed), please bring them with you on the day of your procedure.   

## 2019-04-23 NOTE — Progress Notes (Signed)
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 Pamella Pert regarding new onset severe GERD.  Patient reports that she was in her usual state of health until June 2020 when she began to develop epigastric pain and problems with nausea and vomiting.  Significant pyrosis and regurgitation.  Occasionally vomit remote meals.  No hematemesis.  She was placed on omeprazole 20 mg daily then 20 mg twice daily.  On the twice daily dose of medication she has marked improvement though still with some nausea and regurgitation.  Off medication significant symptoms with vomiting and severe epigastric pain.  No dysphasia.  No weight loss.  Weight has been stable over the past year.  She does describe alternating bowel habits to include constipation and diarrhea.  Fiber does not help.  Review of blood work from March 15, 2019 finds unremarkable comprehensive metabolic panel.  Potassium 3.4.  Normal CBC with hemoglobin 14.1.  No relevant x-rays in the radiology file to report on  REVIEW OF SYSTEMS:  All non-GI ROS negative unless otherwise stated in the HPI except for allergies, headaches  Past Medical History:  Diagnosis Date  . Allergy   . Contraceptive management   . Depression   . Frequent headaches   . GERD (gastroesophageal reflux disease)   . Meralgia paraesthetica 2019    Past Surgical History:  Procedure Laterality Date  . WISDOM TOOTH EXTRACTION      Social History Kathleen Lambert  reports that she has never smoked. She has never used smokeless tobacco. She reports current alcohol use of about 1.0 standard drinks of alcohol per week. She reports that she does not use drugs.  family history includes Breast cancer in her paternal aunt; Diabetes in her maternal grandmother; Heart disease in her maternal grandfather, maternal grandmother, and paternal grandfather; Hyperlipidemia in  her father; Hypertension in her father and maternal grandmother; Lung cancer in her paternal uncle; Mental illness in her mother; Stroke in her paternal grandfather.  Allergies  Allergen Reactions  . Augmentin [Amoxicillin-Pot Clavulanate] Nausea Only       PHYSICAL EXAMINATION: Vital signs: BP 124/70 (BP Location: Left Arm, Patient Position: Sitting, Cuff Size: Large)   Pulse 96   Temp 98.4 F (36.9 C)   Ht 5' 7.5" (1.715 m) Comment: height measured without shoes  Wt 263 lb 6 oz (119.5 kg)   BMI 40.64 kg/m   Constitutional: generally well-appearing, no acute distress Psychiatric: alert and oriented x3, cooperative Eyes: extraocular movements intact, anicteric, conjunctiva pink Mouth: oral pharynx moist, no lesions Neck: supple no lymphadenopathy Cardiovascular: heart regular rate and rhythm, no murmur Lungs: clear to auscultation bilaterally Abdomen: soft, obese nontender, nondistended, no obvious ascites, no peritoneal signs, normal bowel sounds, no organomegaly Rectal: Omitted Extremities: no clubbing, cyanosis, or lower extremity edema bilaterally Skin: no lesions on visible extremities Neuro: No focal deficits.  Cranial nerves intact  ASSESSMENT:  1.  GERD.  Requiring PPI to control symptoms 2.  Epigastric pain.  Likely related to the same 3.  Obesity.  Significantly contributing to GERD issues 4.  Alternating bowel habits without alarm features  PLAN:  1.  Reflux precautions with attention to weight loss.  Literature on GERD and reflux precautions provided 2.  Continue omeprazole total 40 mg daily 3.  Schedule diagnostic upper endoscopy.The nature of the procedure, as well as the risks, benefits, and alternatives were carefully and  thoroughly reviewed with the patient. Ample time for discussion and questions allowed. The patient understood, was satisfied, and agreed to proceed. A copy of this consultation note has been sent to Dr. Leretha PolSantiago

## 2019-04-30 ENCOUNTER — Telehealth: Payer: Self-pay

## 2019-04-30 NOTE — Telephone Encounter (Signed)
Covid-19 screening questions   Do you now or have you had a fever in the last 14 days?  Do you have any respiratory symptoms of shortness of breath or cough now or in the last 14 days?  Do you have any family members or close contacts with diagnosed or suspected Covid-19 in the past 14 days?  Have you been tested for Covid-19 and found to be positive?       

## 2019-05-01 ENCOUNTER — Other Ambulatory Visit: Payer: Self-pay | Admitting: Internal Medicine

## 2019-05-01 ENCOUNTER — Ambulatory Visit (AMBULATORY_SURGERY_CENTER): Payer: No Typology Code available for payment source | Admitting: Internal Medicine

## 2019-05-01 ENCOUNTER — Ambulatory Visit (INDEPENDENT_AMBULATORY_CARE_PROVIDER_SITE_OTHER): Payer: No Typology Code available for payment source | Admitting: Psychology

## 2019-05-01 ENCOUNTER — Other Ambulatory Visit: Payer: Self-pay

## 2019-05-01 ENCOUNTER — Encounter: Payer: Self-pay | Admitting: Internal Medicine

## 2019-05-01 VITALS — BP 131/70 | HR 70 | Temp 98.8°F | Resp 21 | Ht 67.0 in | Wt 263.0 lb

## 2019-05-01 DIAGNOSIS — K3189 Other diseases of stomach and duodenum: Secondary | ICD-10-CM

## 2019-05-01 DIAGNOSIS — F4310 Post-traumatic stress disorder, unspecified: Secondary | ICD-10-CM

## 2019-05-01 DIAGNOSIS — K219 Gastro-esophageal reflux disease without esophagitis: Secondary | ICD-10-CM | POA: Diagnosis present

## 2019-05-01 DIAGNOSIS — R1013 Epigastric pain: Secondary | ICD-10-CM

## 2019-05-01 MED ORDER — SODIUM CHLORIDE 0.9 % IV SOLN: 500.0000 mL | Freq: Once | INTRAVENOUS | Status: DC

## 2019-05-01 NOTE — Progress Notes (Signed)
Pt's states no medical or surgical changes since previsit or office visit. 

## 2019-05-01 NOTE — Progress Notes (Signed)
PT taken to PACU. Monitors in place. VSS. Report given to RN. 

## 2019-05-01 NOTE — Patient Instructions (Signed)
Handout given for GERD and hiatal hernia.  YOU HAD AN ENDOSCOPIC PROCEDURE TODAY AT [facility]:   Refer to the procedure report that was given to you for any specific questions about what was found during the examination.  If the procedure report does not answer your questions, please call your gastroenterologist to clarify.  If you requested that your care partner not be given the details of your procedure findings, then the procedure report has been included in a sealed envelope for you to review at your convenience later.  YOU SHOULD EXPECT: Some feelings of bloating in the abdomen. Passage of more gas than usual.  Walking can help get rid of the air that was put into your GI tract during the procedure and reduce the bloating. If you had a lower endoscopy (such as a colonoscopy or flexible sigmoidoscopy) you may notice spotting of blood in your stool or on the toilet paper. If you underwent a bowel prep for your procedure, you may not have a normal bowel movement for a few days.  Please Note:  You might notice some irritation and congestion in your nose or some drainage.  This is from the oxygen used during your procedure.  There is no need for concern and it should clear up in a day or so.  SYMPTOMS TO REPORT IMMEDIATELY:   Following upper endoscopy (EGD)  Vomiting of blood or coffee ground material  New chest pain or pain under the shoulder blades  Painful or persistently difficult swallowing  New shortness of breath  Fever of 100F or higher  Black, tarry-looking stools  For urgent or emergent issues, a gastroenterologist can be reached at any hour by calling [phone].   DIET:  We do recommend a small meal at first, but then you may proceed to your regular diet.  Drink plenty of fluids but you should avoid alcoholic beverages for 24 hours.  ACTIVITY:  You should plan to take it easy for the rest of today and you should NOT DRIVE or use heavy machinery until  tomorrow (because of the sedation medicines used during the test).    FOLLOW UP: Our staff will call the number listed on your records 48-72 hours following your procedure to check on you and address any questions or concerns that you may have regarding the information given to you following your procedure. If we do not reach you, we will leave a message.  We will attempt to reach you two times.  During this call, we will ask if you have developed any symptoms of COVID 19. If you develop any symptoms (ie: fever, flu-like symptoms, shortness of breath, cough etc.) before then, please call [phone].  If you test positive for Covid 19 in the 2 weeks post procedure, please call and report this information to [place].    If any biopsies were taken you will be contacted by phone or by letter within the next 1-3 weeks.  Please call us at [phone] if you have not heard about the biopsies in 3 weeks.    SIGNATURES/CONFIDENTIALITY: You and/or your care partner have signed paperwork which will be entered into your electronic medical record.  These signatures attest to the fact that that the information above on your After Visit Summary has been reviewed and is understood.  Full responsibility of the confidentiality of this discharge information lies with you and/or your care-partner.

## 2019-05-01 NOTE — Op Note (Signed)
[name] Procedure Date: 05/01/2019 2:35 PM MRN: [id number] Endoscopist: Wilhemina Bonito. Marina Goodell , MD Age: 33 Referring MD:  Date of Birth: 11-21-1985 Gender: Female Account #: [id number] Procedure:                Upper GI endoscopy with biopsies Indications:              Epigastric abdominal pain, Esophageal reflux Medicines:                Monitored Anesthesia Care Procedure:                Pre-Anesthesia Assessment:                           - Prior to the procedure, a History and Physical                            was performed, and patient medications and                            allergies were reviewed. The patient's tolerance of                            previous anesthesia was also reviewed. The risks                            and benefits of the procedure and the sedation                            options and risks were discussed with the patient.                            All questions were answered, and informed consent                            was obtained. Prior Anticoagulants: The patient has                            taken no previous anticoagulant or antiplatelet                            agents. ASA Grade Assessment: II - A patient with                            mild systemic disease. After reviewing the risks                            and benefits, the patient was deemed in                            satisfactory condition to undergo the procedure.                           After obtaining informed consent, the endoscope was  passed under direct vision. Throughout the                            procedure, the patient's blood pressure, pulse, and                            oxygen saturations were monitored continuously. The                            Endoscope was introduced through the mouth, and                            advanced to the second part of duodenum. The upper    GI endoscopy was accomplished without difficulty.                            The patient tolerated the procedure well. Scope In: Scope Out: Findings:                 The esophagus was normal.                           The stomach was normal. Small hiatal hernia was                            present. Biopsies of the gastric antrum were taken                            to rule out Helicobacter pylori                           The examined duodenum was normal.                           The cardia and gastric fundus were normal on                            retroflexion. Complications:            No immediate complications. Estimated Blood Loss:     Estimated blood loss: none. Impression:               1. Normal EGD                           2. GERD                           3. Status post biopsies for Helicobacter pylori. Recommendation:           - Patient has a contact number available for                            emergencies. The signs and symptoms of potential                            delayed complications were discussed with the  patient. Return to normal activities tomorrow.                            Written discharge instructions were provided to the                            patient.                           - Resume previous diet.                           - Continue present medications.                           - Await pathology results.                           - Reflux precautions with attention to weight loss                           - Return to the care of Dr. Pamella Pert. GI follow-up                            as needed Docia Chuck. Henrene Pastor, MD 05/01/2019 3:05:52 PM This report has been signed electronically.

## 2019-05-03 ENCOUNTER — Telehealth: Payer: Self-pay | Admitting: *Deleted

## 2019-05-03 ENCOUNTER — Telehealth: Payer: Self-pay

## 2019-05-03 NOTE — Telephone Encounter (Signed)
  Follow up Call-  Call back number 05/01/2019  Post procedure Call Back phone  # [phone]  Permission to leave phone message Yes  Some recent data might be hidden     Patient questions:  VM box is full.

## 2019-05-03 NOTE — Telephone Encounter (Signed)
Second follow up call attempt, no answer, unable to LM 

## 2019-05-03 NOTE — Telephone Encounter (Signed)
Pt returned call and said she is doing okay from the procedure

## 2019-05-08 ENCOUNTER — Ambulatory Visit (INDEPENDENT_AMBULATORY_CARE_PROVIDER_SITE_OTHER): Payer: No Typology Code available for payment source | Admitting: Psychology

## 2019-05-08 DIAGNOSIS — F4310 Post-traumatic stress disorder, unspecified: Secondary | ICD-10-CM

## 2019-05-09 ENCOUNTER — Encounter: Payer: Self-pay | Admitting: Internal Medicine

## 2019-05-14 MED FILL — traZODone HCL 50 MG TABS: 50 | 22 days supply | Qty: 45 | Fill #1

## 2019-05-16 ENCOUNTER — Ambulatory Visit (INDEPENDENT_AMBULATORY_CARE_PROVIDER_SITE_OTHER): Payer: No Typology Code available for payment source | Admitting: Psychology

## 2019-05-16 DIAGNOSIS — F4310 Post-traumatic stress disorder, unspecified: Secondary | ICD-10-CM

## 2019-05-21 ENCOUNTER — Ambulatory Visit (INDEPENDENT_AMBULATORY_CARE_PROVIDER_SITE_OTHER): Payer: No Typology Code available for payment source | Admitting: Psychology

## 2019-05-21 DIAGNOSIS — F4310 Post-traumatic stress disorder, unspecified: Secondary | ICD-10-CM

## 2019-05-22 ENCOUNTER — Other Ambulatory Visit: Payer: Self-pay

## 2019-05-22 DIAGNOSIS — Z20822 Contact with and (suspected) exposure to covid-19: Secondary | ICD-10-CM

## 2019-05-24 LAB — NOVEL CORONAVIRUS, NAA: SARS-CoV-2, NAA: NOT DETECTED

## 2019-06-06 MED FILL — DESVENLAFAXINE SUC ER 100 M: 100 | 30 days supply | Qty: 30 | Fill #1

## 2019-06-06 MED FILL — OMEPRAZOLE 20 MG CAPSULE DR: 20 | 30 days supply | Qty: 60 | Fill #2

## 2019-06-12 ENCOUNTER — Ambulatory Visit (INDEPENDENT_AMBULATORY_CARE_PROVIDER_SITE_OTHER): Payer: No Typology Code available for payment source | Admitting: Psychology

## 2019-06-12 DIAGNOSIS — F4310 Post-traumatic stress disorder, unspecified: Secondary | ICD-10-CM

## 2019-06-20 ENCOUNTER — Ambulatory Visit: Payer: No Typology Code available for payment source | Admitting: Psychology

## 2019-06-25 ENCOUNTER — Encounter (HOSPITAL_COMMUNITY): Payer: Self-pay | Admitting: Psychiatry

## 2019-06-25 ENCOUNTER — Ambulatory Visit (INDEPENDENT_AMBULATORY_CARE_PROVIDER_SITE_OTHER): Payer: No Typology Code available for payment source | Admitting: Psychiatry

## 2019-06-25 ENCOUNTER — Other Ambulatory Visit: Payer: Self-pay

## 2019-06-25 DIAGNOSIS — F330 Major depressive disorder, recurrent, mild: Secondary | ICD-10-CM | POA: Diagnosis not present

## 2019-06-25 DIAGNOSIS — F4310 Post-traumatic stress disorder, unspecified: Secondary | ICD-10-CM

## 2019-06-25 DIAGNOSIS — F419 Anxiety disorder, unspecified: Secondary | ICD-10-CM

## 2019-06-25 MED ORDER — TRAZODONE HCL 50 MG PO TABS: 50.0000 mg | ORAL_TABLET | Freq: Every day | ORAL | 2 refills | Status: DC

## 2019-06-25 MED ORDER — DESVENLAFAXINE SUCCINATE ER 100 MG PO TB24: 100.0000 mg | ORAL_TABLET | Freq: Every day | ORAL | 2 refills | Status: DC

## 2019-06-25 MED ORDER — LORAZEPAM 0.5 MG PO TABS: 0.5000 mg | ORAL_TABLET | Freq: Every day | ORAL | 1 refills | Status: DC | PRN

## 2019-06-25 MED FILL — traZODone HCL 50 MG TABS: 50 | 30 days supply | Qty: 30 | Fill #0

## 2019-06-25 MED FILL — LORAZEPAM 0.5 MG TABS: 0.5 | 20 days supply | Qty: 20 | Fill #0

## 2019-06-25 NOTE — Progress Notes (Signed)
Virtual Visit via Telephone Note  I connected with Kathleen Lambert on 06/25/19 at  2:20 PM EST by telephone and verified that I am speaking with the correct person using two identifiers.   I discussed the limitations, risks, security and privacy concerns of performing an evaluation and management service by telephone and the availability of in person appointments. I also discussed with the patient that there may be a patient responsible charge related to this service. The patient expressed understanding and agreed to proceed.   History of Present Illness: Patient requested earlier appointment.  She was evaluated through phone session.  She admitted increased anxiety and panic attack as her job is very stressful.  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  There is no bed and there is a steady increase of Covid patient.  She feels very nervous and anxious and there are times after the shift she started to cry.  She feels her depression is a stable and she is sleeping better with trazodone.  She has no nightmares or flashbacks but her anxiety is getting worse.  She denies any suicidal thoughts or homicidal thoughts.  Her appetite is okay.  Her weight is unchanged from the past.  She reported no side effects from the medication.  She has no tremors, shakes or any EPS.  She lives by herself.  She denies drinking or using any illegal substances.   Past Psychiatric History: H/Odepression andPTSD.Takingantidepressants since January 2019. Tried Lexapro, Wellbutrin, Cymbalta, Abilify and Zoloft. Recently hydroxyzine with poor response.  No history of psychiatric inpatient treatment, suicidal attempt, mania, psychosis or any hallucination.We did gene site testing and results reviewed.  Psychiatric Specialty Exam: Physical Exam  ROS  There were no vitals taken for this visit.There is no height or weight on file  to calculate BMI.  General Appearance: NA  Eye Contact:  NA  Speech:  Clear and Coherent  Volume:  Normal  Mood:  Anxious  Affect:  NA  Thought Process:  Goal Directed  Orientation:  Full (Time, Place, and Person)  Thought Content:  Rumination  Suicidal Thoughts:  No  Homicidal Thoughts:  No  Memory:  Immediate;   Good Recent;   Good Remote;   Good  Judgement:  Good  Insight:  Good  Psychomotor Activity:  NA  Concentration:  Concentration: Good and Attention Span: Good  Recall:  Good  Fund of Knowledge:  Good  Language:  Good  Akathisia:  No  Handed:  Right  AIMS (if indicated):     Assets:  Communication Skills Desire for Improvement Housing Resilience Social Support Talents/Skills  ADL's:  Intact  Cognition:  WNL  Sleep:   better      Assessment and Plan: Major depressive disorder, recurrent.  Posttraumatic stress disorder.  Anxiety.  Recommended to add low-dose lorazepam 0.5 mg to take as needed for severe anxiety and panic attack.  Discussed benzodiazepine abuse, tolerance and withdrawal.  She agreed that she will not take unless it is important.  I will continue Pristiq 100 mg in the morning and trazodone 50 mg at bedtime.  Encouraged to continue therapy with Olena Heckle for EMDR.  Recommended to call us back if she is any question of any concern.  Follow-up in 3 months.  Follow Up Instructions:    I discussed the assessment and treatment plan with the patient. The patient was provided an opportunity to ask  questions and all were answered. The patient agreed with the plan and demonstrated an understanding of the instructions.   The patient was advised to call back or seek an in-person evaluation if the symptoms worsen or if the condition fails to improve as anticipated.  I provided 15 minutes of non-face-to-face time during this encounter.   Kathlee Nations, MD

## 2019-06-28 ENCOUNTER — Ambulatory Visit (INDEPENDENT_AMBULATORY_CARE_PROVIDER_SITE_OTHER): Payer: No Typology Code available for payment source | Admitting: Psychology

## 2019-06-28 DIAGNOSIS — F4310 Post-traumatic stress disorder, unspecified: Secondary | ICD-10-CM

## 2019-07-03 ENCOUNTER — Ambulatory Visit (INDEPENDENT_AMBULATORY_CARE_PROVIDER_SITE_OTHER): Payer: No Typology Code available for payment source | Admitting: Psychology

## 2019-07-03 DIAGNOSIS — F4310 Post-traumatic stress disorder, unspecified: Secondary | ICD-10-CM | POA: Diagnosis not present

## 2019-07-10 MED FILL — OMEPRAZOLE 20 MG CAPSULE DR: 20 | 30 days supply | Qty: 60 | Fill #3

## 2019-07-10 MED FILL — DESVENLAFAXINE SUC ER 100 M: 100 | 30 days supply | Qty: 30 | Fill #2

## 2019-07-11 ENCOUNTER — Ambulatory Visit (INDEPENDENT_AMBULATORY_CARE_PROVIDER_SITE_OTHER): Payer: No Typology Code available for payment source | Admitting: Psychology

## 2019-07-11 ENCOUNTER — Other Ambulatory Visit: Payer: Self-pay | Admitting: Family Medicine

## 2019-07-11 DIAGNOSIS — F4310 Post-traumatic stress disorder, unspecified: Secondary | ICD-10-CM | POA: Diagnosis not present

## 2019-07-11 NOTE — Telephone Encounter (Signed)
Requested medication (s) are due for refill today: no  Requested medication (s) are on the active medication list: yes  Last refill:  08/10/2018  Future visit scheduled: no  Notes to clinic: review for refill Overdue for follow up   Requested Prescriptions  Pending Prescriptions Disp Refills   VENTOLIN HFA 108 (90 Base) MCG/ACT inhaler [Pharmacy Med Name: VENTOLIN HFA 90 MCG INHALER 108 (90 BAS AERO] 18 g 0    Sig: INHALE 2 PUFFS INTO THE LUNGS EVERY 6 HOURS AS NEEDED FOR WHEEZING OR SHORTNESS OF BREATH.     Pulmonology:  Beta Agonists Failed - 07/11/2019 11:29 AM      Failed - One inhaler should last at least one month. If the patient is requesting refills earlier, contact the patient to check for uncontrolled symptoms.      Passed - Valid encounter within last 12 months    Recent Outpatient Visits          3 months ago Irregular menses   [facility], Lilia Argue, MD   5 months ago Women's annual routine gynecological examination   [facility], Lilia Argue, MD   9 months ago Anxiety and depression   [facility], Lilia Argue, MD   10 months ago Anxiety and depression   [facility], Lilia Argue, MD   11 months ago Anxiety and depression   [facility], Lilia Argue, MD

## 2019-07-16 ENCOUNTER — Ambulatory Visit (INDEPENDENT_AMBULATORY_CARE_PROVIDER_SITE_OTHER): Payer: No Typology Code available for payment source | Admitting: Psychology

## 2019-07-16 DIAGNOSIS — F4310 Post-traumatic stress disorder, unspecified: Secondary | ICD-10-CM

## 2019-07-16 MED FILL — ALBUTEROL SULFATE HFA 108 (: 108 (90 BAS | 25 days supply | Qty: 18 | Fill #0

## 2019-07-17 ENCOUNTER — Ambulatory Visit (HOSPITAL_COMMUNITY): Payer: No Typology Code available for payment source | Admitting: Psychiatry

## 2019-07-17 ENCOUNTER — Other Ambulatory Visit: Payer: Self-pay | Admitting: Family Medicine

## 2019-07-23 ENCOUNTER — Encounter: Payer: Self-pay | Admitting: Family Medicine

## 2019-07-24 MED ORDER — PANTOPRAZOLE SODIUM 40 MG PO TBEC: 40.0000 mg | DELAYED_RELEASE_TABLET | Freq: Every day | ORAL | 0 refills | Status: DC

## 2019-07-24 MED FILL — PANTOPRAZOLE SOD DR 40 MG T: 40 | 30 days supply | Qty: 30 | Fill #0

## 2019-07-30 ENCOUNTER — Ambulatory Visit (INDEPENDENT_AMBULATORY_CARE_PROVIDER_SITE_OTHER): Payer: No Typology Code available for payment source | Admitting: Psychology

## 2019-07-30 DIAGNOSIS — F4310 Post-traumatic stress disorder, unspecified: Secondary | ICD-10-CM

## 2019-08-09 ENCOUNTER — Ambulatory Visit (INDEPENDENT_AMBULATORY_CARE_PROVIDER_SITE_OTHER): Payer: No Typology Code available for payment source | Admitting: Psychology

## 2019-08-09 DIAGNOSIS — F4310 Post-traumatic stress disorder, unspecified: Secondary | ICD-10-CM | POA: Diagnosis not present

## 2019-08-14 MED FILL — DESVENLAFAXINE SUC ER 100 M: 100 | 30 days supply | Qty: 30 | Fill #0

## 2019-08-22 ENCOUNTER — Ambulatory Visit (INDEPENDENT_AMBULATORY_CARE_PROVIDER_SITE_OTHER): Payer: No Typology Code available for payment source | Admitting: Psychology

## 2019-08-22 DIAGNOSIS — F4310 Post-traumatic stress disorder, unspecified: Secondary | ICD-10-CM | POA: Diagnosis not present

## 2019-08-23 ENCOUNTER — Other Ambulatory Visit: Payer: Self-pay | Admitting: Family Medicine

## 2019-08-23 MED FILL — PANTOPRAZOLE SOD DR 40 MG T: 40 | 90 days supply | Qty: 90 | Fill #0

## 2019-09-05 ENCOUNTER — Ambulatory Visit (INDEPENDENT_AMBULATORY_CARE_PROVIDER_SITE_OTHER): Payer: No Typology Code available for payment source | Admitting: Psychology

## 2019-09-05 DIAGNOSIS — F4310 Post-traumatic stress disorder, unspecified: Secondary | ICD-10-CM

## 2019-09-12 MED FILL — DESVENLAFAXINE SUC ER 100 M: 100 | 30 days supply | Qty: 30 | Fill #1

## 2019-09-17 ENCOUNTER — Ambulatory Visit (HOSPITAL_COMMUNITY): Payer: No Typology Code available for payment source | Admitting: Psychiatry

## 2019-09-17 ENCOUNTER — Other Ambulatory Visit: Payer: Self-pay

## 2019-09-18 ENCOUNTER — Ambulatory Visit (INDEPENDENT_AMBULATORY_CARE_PROVIDER_SITE_OTHER): Payer: No Typology Code available for payment source | Admitting: Psychology

## 2019-09-18 ENCOUNTER — Ambulatory Visit (INDEPENDENT_AMBULATORY_CARE_PROVIDER_SITE_OTHER): Payer: No Typology Code available for payment source

## 2019-09-18 ENCOUNTER — Other Ambulatory Visit: Payer: Self-pay

## 2019-09-18 ENCOUNTER — Encounter: Payer: Self-pay | Admitting: Registered Nurse

## 2019-09-18 ENCOUNTER — Ambulatory Visit (INDEPENDENT_AMBULATORY_CARE_PROVIDER_SITE_OTHER): Payer: No Typology Code available for payment source | Admitting: Registered Nurse

## 2019-09-18 VITALS — BP 123/85 | HR 96 | Temp 97.7°F | Resp 17 | Ht 67.0 in | Wt 285.8 lb

## 2019-09-18 DIAGNOSIS — F4310 Post-traumatic stress disorder, unspecified: Secondary | ICD-10-CM

## 2019-09-18 DIAGNOSIS — S8992XA Unspecified injury of left lower leg, initial encounter: Secondary | ICD-10-CM | POA: Diagnosis not present

## 2019-09-18 MED ORDER — METHOCARBAMOL 500 MG PO TABS: 500.0000 mg | ORAL_TABLET | Freq: Four times a day (QID) | ORAL | 0 refills | Status: DC

## 2019-09-18 MED ORDER — MELOXICAM 15 MG PO TABS: 15.0000 mg | ORAL_TABLET | Freq: Every day | ORAL | 0 refills | Status: DC

## 2019-09-18 MED FILL — MELOXICAM 15 MG TABLET: 15 | 30 days supply | Qty: 30 | Fill #0

## 2019-09-18 MED FILL — METHOCARBAMOL 500 MG TABS: 500 | 11 days supply | Qty: 45 | Fill #0

## 2019-09-18 NOTE — Progress Notes (Signed)
Acute Office Visit  Subjective:    Patient ID: Kathleen Lambert, female    DOB: [birth year], 34 y.o.   MRN: [id number]  Chief Complaint  Patient presents with   Knee Pain    patient slipped hiking in the park on 09[place]15/19 and hurt her left knee. Per patient she has been used ace wrap , ice , and motrin for the pain    HPI Patient is in today for L knee injury  Walking outside on 09/15/19 and slipped - felt like knee came out of place and slid back into place. Now having pain and swelling, some trouble weight bearing but able to tolerate without crutches. Pain is largely on medial edge of patella. Denies joint line pain. Pain does not radiate upwards or downwards.   Past Medical History:  Diagnosis Date   Allergy    Anxiety    Contraceptive management    Depression    Frequent headaches    GERD (gastroesophageal reflux disease)    Meralgia paraesthetica 2019    Past Surgical History:  Procedure Laterality Date   WISDOM TOOTH EXTRACTION      Family History  Problem Relation Age of Onset   Mental illness Mother        borderline personality   Hypertension Father    Hyperlipidemia Father    Breast cancer Paternal Aunt    Lung cancer Paternal Uncle    Diabetes Maternal Grandmother    Heart disease Maternal Grandmother    Hypertension Maternal Grandmother    Heart disease Maternal Grandfather    Stroke Paternal Grandfather    Heart disease Paternal Grandfather    Colon cancer Neg Hx    Esophageal cancer Neg Hx    Stomach cancer Neg Hx    Rectal cancer Neg Hx     Social History   Socioeconomic History   Marital status: Single    Spouse name: Not on file   Number of children: 0   Years of education: Not on file   Highest education level: Bachelor's degree (e.g., BA, AB, BS)  Occupational History   Occupation: Therapist, 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   Lives alone with her cats, Katniss and Mogli.   Social Determinants of Health   Financial Resource Strain: Low Risk    Difficulty of Paying Living Expenses: Not hard at all  Food Insecurity: No Food Insecurity   Worried About Charity fundraiser in the Last Year: Never true   [place] in the Last Year: Never true  Transportation Needs: No Transportation Needs   Lack of Transportation (Medical): No   Lack of Transportation (Non-Medical): No  Physical Activity: Unknown   Days of Exercise per Week: Not on file   Minutes of Exercise per Session: 30 min  Stress: Stress Concern Present   Feeling of Stress : Very much  Social Connections: Moderately Isolated   Frequency of Communication with Friends and Family: More than three times a week   Frequency of Social Gatherings with Friends and  Family: Twice a week   Attends Religious Services: Never   Database administrator or Organizations: No   Attends Engineer, structural: Never   Marital Status: Never married  Catering manager Violence: Not At Risk   Fear of Current or Ex-Partner: No   Emotionally Abused: No   Physically Abused: No   Sexually Abused: No    Outpatient Medications Prior to Visit  Medication Sig Dispense Refill   desvenlafaxine (PRISTIQ) 100 MG 24 hr tablet Take 1 tablet (100 mg total) by mouth daily. 30 tablet 2   LORazepam (ATIVAN) 0.5 MG tablet Take 1 tablet (0.5 mg total) by mouth daily as needed for anxiety. 20 tablet 1   ondansetron (ZOFRAN ODT) 4 MG disintegrating tablet Take 1 tablet (4 mg total) by mouth every 8 (eight) hours as needed for nausea or vomiting. 12 tablet 0   pantoprazole (PROTONIX) 40 MG tablet TAKE 1 TABLET BY MOUTH DAILY. 90 tablet 0    traZODone (DESYREL) 50 MG tablet Take 1 tablet (50 mg total) by mouth at bedtime. 30 tablet 2   VENTOLIN HFA 108 (90 Base) MCG/ACT inhaler INHALE 2 PUFFS INTO THE LUNGS EVERY 6 HOURS AS NEEDED FOR WHEEZING OR SHORTNESS OF BREATH. 18 g 0   No facility-administered medications prior to visit.    Allergies  Allergen Reactions   Augmentin [Amoxicillin-Pot Clavulanate] Nausea Only    Review of Systems Per hpi      Objective:    Physical Exam Limited flexion in affected knee. Full extension.  No bruising erythema or visible effusion. Xrays reassuring.   BP 123/85   Pulse 96   Temp 97.7 F (36.5 C) (Temporal)   Resp 17   Ht 5\' 7"  (1.702 m)   Wt 285 lb 12.8 oz (129.6 kg)   SpO2 96%   BMI 44.76 kg/m  Wt Readings from Last 3 Encounters:  09/18/19 285 lb 12.8 oz (129.6 kg)  05/01/19 263 lb (119.3 kg)  04/23/19 263 lb 6 oz (119.5 kg)    There are no preventive care reminders to display for this patient.  There are no preventive care reminders to display for this patient.   Lab Results  Component Value Date   TSH 1.730 01/24/2019   Lab Results  Component Value Date   WBC 5.3 03/15/2019   HGB 14.1 03/15/2019   HCT 42.0 03/15/2019   MCV 88.4 03/15/2019   PLT 380 03/15/2019   Lab Results  Component Value Date   NA 136 03/15/2019   K 3.4 (L) 03/15/2019   CO2 23 03/15/2019   GLUCOSE 77 03/15/2019   BUN 12 03/15/2019   CREATININE 0.71 03/15/2019   BILITOT 1.0 03/15/2019   ALKPHOS 94 03/15/2019   AST 19 03/15/2019   ALT 24 03/15/2019   PROT 8.0 03/15/2019   ALBUMIN 4.1 03/15/2019   CALCIUM 9.2 03/15/2019   ANIONGAP 12 03/15/2019   GFR 135.57 03/07/2017   Lab Results  Component Value Date   CHOL 175 01/24/2019   Lab Results  Component Value Date   HDL 50 01/24/2019   Lab Results  Component Value Date   LDLCALC 113 (H) 01/24/2019   Lab Results  Component Value Date   TRIG 60 01/24/2019   Lab Results  Component Value Date   CHOLHDL 3.5 01/24/2019    Lab Results  Component Value Date   HGBA1C 5.6 03/07/2017       Assessment & Plan:   Problem List Items Addressed This Visit  None      Visit Diagnoses     Injury of left knee, initial encounter    -  Primary   Relevant Medications   meloxicam (MOBIC) 15 MG tablet   methocarbamol (ROBAXIN) 500 MG tablet   Other Relevant Orders   DG Knee Complete 4 Views Left (Completed)        Meds ordered this encounter  Medications   meloxicam (MOBIC) 15 MG tablet    Sig: Take 1 tablet (15 mg total) by mouth daily.    Dispense:  30 tablet    Refill:  0    Order Specific Question:   Supervising Provider    Answer:   Collie Siad A [[id number]]   methocarbamol (ROBAXIN) 500 MG tablet    Sig: Take 1 tablet (500 mg total) by mouth 4 (four) times daily.    Dispense:  45 tablet    Refill:  0    Order Specific Question:   Supervising Provider    Answer:   Doristine Bosworth [id number]   PLAN Meloxicam and robaxin for relief Suggest stretching exercises Will write work note If no improvement will consider PT vs ortho Patient encouraged to call clinic with any questions, comments, or concerns.   Janeece Agee, NP

## 2019-09-18 NOTE — Patient Instructions (Signed)
° ° ° °  If you have lab work done today you will be contacted with your lab results within the next 2 weeks.  If you have not heard from us then please contact us. The fastest way to get your results is to register for My Chart. ° ° °IF you received an x-ray today, you will receive an invoice from [facility]. Please contact [facility] at [phone] with questions or concerns regarding your invoice.  ° °IF you received labwork today, you will receive an invoice from [facility]. Please contact [facility] at [phone] with questions or concerns regarding your invoice.  ° °Our billing staff will not be able to assist you with questions regarding bills from these companies. ° °You will be contacted with the lab results as soon as they are available. The fastest way to get your results is to activate your My Chart account. Instructions are located on the last page of this paperwork. If you have not heard from us regarding the results in 2 weeks, please contact this office. °  ° ° ° °

## 2019-10-03 ENCOUNTER — Ambulatory Visit (INDEPENDENT_AMBULATORY_CARE_PROVIDER_SITE_OTHER): Payer: No Typology Code available for payment source | Admitting: Psychology

## 2019-10-03 ENCOUNTER — Other Ambulatory Visit: Payer: Self-pay

## 2019-10-03 ENCOUNTER — Ambulatory Visit (INDEPENDENT_AMBULATORY_CARE_PROVIDER_SITE_OTHER): Payer: No Typology Code available for payment source | Admitting: Psychiatry

## 2019-10-03 ENCOUNTER — Encounter (HOSPITAL_COMMUNITY): Payer: Self-pay | Admitting: Psychiatry

## 2019-10-03 DIAGNOSIS — F4310 Post-traumatic stress disorder, unspecified: Secondary | ICD-10-CM

## 2019-10-03 DIAGNOSIS — F419 Anxiety disorder, unspecified: Secondary | ICD-10-CM | POA: Diagnosis not present

## 2019-10-03 DIAGNOSIS — F330 Major depressive disorder, recurrent, mild: Secondary | ICD-10-CM | POA: Diagnosis not present

## 2019-10-03 MED ORDER — DESVENLAFAXINE SUCCINATE ER 100 MG PO TB24: 100.0000 mg | ORAL_TABLET | Freq: Every day | ORAL | 0 refills | Status: DC

## 2019-10-03 NOTE — Progress Notes (Signed)
Virtual Visit via Telephone Note  I connected with Kathleen Lambert on 10/03/19 at  1:40 PM EST by telephone and verified that I am speaking with the correct person using two identifiers.   I discussed the limitations, risks, security and privacy concerns of performing an evaluation and management service by telephone and the availability of in person appointments. I also discussed with the patient that there may be a patient responsible charge related to this service. The patient expressed understanding and agreed to proceed.   History of Present Illness: Patient was evaluated through phone session.  She is doing better since job stress is less.  She has taken few times lorazepam that help and calm her down.  Her past few weeks she does not have any panic attack.  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  Patient told because of more money and flexibility she chooses and hoping to move [place] end of May.  She is taking Pristiq which is helping her depression.  She takes trazodone only as needed and it works very well when she takes it.  She lives by herself but she has good friends of network.  She denies any tremors, shakes, irritability.  She denies any crying spells.  She denies any feeling of hopelessness or worthlessness.  She has no more nightmares and flashback.  She still virtual therapy with Annette Stable.  She denies drinking or using any illegal substances.  Her energy level is good.  Her appetite is okay.  Past Psychiatric History: H/Odepression andPTSD.Takingantidepressants since January 2019. Tried Lexapro, Wellbutrin, Cymbalta, Abilify and Zoloft. Recently hydroxyzine with poor response.No history of psychiatric inpatient treatment, suicidal attempt, mania, psychosis or any hallucination.We did gene site testing and results reviewed.    Psychiatric Specialty Exam: Physical Exam  Review of Systems  There were no vitals taken for this visit.There  is no height or weight on file to calculate BMI.  General Appearance: NA  Eye Contact:  NA  Speech:  Clear and Coherent and Normal Rate  Volume:  Normal  Mood:  Euthymic  Affect:  NA  Thought Process:  Goal Directed  Orientation:  Full (Time, Place, and Person)  Thought Content:  WDL and Logical  Suicidal Thoughts:  No  Homicidal Thoughts:  No  Memory:  Immediate;   Good Recent;   Good Remote;   Good  Judgement:  Good  Insight:  Present  Psychomotor Activity:  NA  Concentration:  Concentration: Good and Attention Span: Good  Recall:  Good  Fund of Knowledge:  Good  Language:  Good  Akathisia:  No  Handed:  Right  AIMS (if indicated):     Assets:  Communication Skills Desire for Improvement Housing Resilience Talents/Skills Transportation  ADL's:  Intact  Cognition:  WNL  Sleep:   ok      Assessment and Plan: Major depressive disorder, recurrent.  PTSD.  Anxiety.  Patient is a stable on her current medication.  She has used 2 times lorazepam 0.5 mg to help her anxiety attack which works.  She does not want to change medication.  She has enough refill for trazodone and lorazepam but like to have a 90-day supply of Pristiq 100 mg.  Discussed medication side effects and benefits.  Encouraged to continue therapy for EMDR.  Patient is hoping to move [place] end of May and like to have appointment before she moved.  We will schedule appointment in 10 weeks.  Recommended to call us back if she has any question of any concern.  Follow-up in 10 weeks.  Follow Up Instructions:    I discussed the assessment and treatment plan with the patient. The patient was provided an opportunity to ask questions and all were answered. The patient agreed with the plan and demonstrated an understanding of the instructions.   The patient was advised to call back or seek an in-person evaluation if the symptoms worsen or if the condition fails to improve as anticipated.  I provided 15 minutes of  non-face-to-face time during this encounter.   Kathlee Nations, MD

## 2019-10-08 MED FILL — DESVENLAFAXINE SUC ER 100 M: 100 | 90 days supply | Qty: 90 | Fill #0

## 2019-10-17 ENCOUNTER — Ambulatory Visit (INDEPENDENT_AMBULATORY_CARE_PROVIDER_SITE_OTHER): Payer: No Typology Code available for payment source | Admitting: Psychology

## 2019-10-17 DIAGNOSIS — F4310 Post-traumatic stress disorder, unspecified: Secondary | ICD-10-CM | POA: Diagnosis not present

## 2019-10-19 MED FILL — DESVENLAFAXINE SUC ER 100 M: 100 | 90 days supply | Qty: 90 | Fill #0

## 2019-10-31 ENCOUNTER — Ambulatory Visit (INDEPENDENT_AMBULATORY_CARE_PROVIDER_SITE_OTHER): Payer: No Typology Code available for payment source | Admitting: Psychology

## 2019-10-31 DIAGNOSIS — F4310 Post-traumatic stress disorder, unspecified: Secondary | ICD-10-CM

## 2019-11-06 ENCOUNTER — Other Ambulatory Visit: Payer: Self-pay | Admitting: Family Medicine

## 2019-11-06 MED ORDER — ALBUTEROL SULFATE HFA 108 (90 BASE) MCG/ACT IN AERS: 1.0000 | INHALATION_SPRAY | Freq: Four times a day (QID) | RESPIRATORY_TRACT | 0 refills | Status: DC | PRN

## 2019-11-06 MED ORDER — PANTOPRAZOLE SODIUM 40 MG PO TBEC: 40.0000 mg | DELAYED_RELEASE_TABLET | Freq: Every day | ORAL | 0 refills | Status: DC

## 2019-11-06 MED FILL — PANTOPRAZOLE SOD DR 40 MG T: 40 | 90 days supply | Qty: 90 | Fill #0

## 2019-11-06 MED FILL — ALBUTEROL SULFATE HFA 108 (: 108 (90 BAS | 50 days supply | Qty: 18 | Fill #0

## 2019-11-23 ENCOUNTER — Telehealth: Payer: Self-pay | Admitting: Family Medicine

## 2019-11-23 NOTE — Telephone Encounter (Signed)
Called pt LVM to call back and schedule fasting labs 3- 4 days before her physical coming yo on 12/13/2019

## 2019-11-26 ENCOUNTER — Ambulatory Visit (INDEPENDENT_AMBULATORY_CARE_PROVIDER_SITE_OTHER): Payer: No Typology Code available for payment source | Admitting: Psychology

## 2019-11-27 DIAGNOSIS — F4310 Post-traumatic stress disorder, unspecified: Secondary | ICD-10-CM | POA: Diagnosis not present

## 2019-11-28 ENCOUNTER — Telehealth: Payer: Self-pay | Admitting: Family Medicine

## 2019-11-28 NOTE — Telephone Encounter (Signed)
Please order labs ,this patient is on nurses schedule for 05.03.21 for labs prior to her physical whicj is on 12/13/19

## 2019-11-28 NOTE — Telephone Encounter (Signed)
Please place labs prior to appt. 

## 2019-12-04 ENCOUNTER — Other Ambulatory Visit: Payer: Self-pay

## 2019-12-04 DIAGNOSIS — Z13220 Encounter for screening for lipoid disorders: Secondary | ICD-10-CM

## 2019-12-04 DIAGNOSIS — Z13228 Encounter for screening for other metabolic disorders: Secondary | ICD-10-CM

## 2019-12-10 ENCOUNTER — Ambulatory Visit: Payer: No Typology Code available for payment source

## 2019-12-10 ENCOUNTER — Other Ambulatory Visit: Payer: Self-pay

## 2019-12-10 DIAGNOSIS — Z13220 Encounter for screening for lipoid disorders: Secondary | ICD-10-CM

## 2019-12-10 DIAGNOSIS — Z13228 Encounter for screening for other metabolic disorders: Secondary | ICD-10-CM

## 2019-12-10 LAB — CMP14+EGFR
ALT: 16 IU/L (ref 0–32)
AST: 11 IU/L (ref 0–40)
Albumin/Globulin Ratio: 1.3 (ref 1.2–2.2)
Albumin: 3.7 g/dL — ABNORMAL LOW (ref 3.8–4.8)
Alkaline Phosphatase: 99 IU/L (ref 39–117)
BUN/Creatinine Ratio: 25 — ABNORMAL HIGH (ref 9–23)
BUN: 15 mg/dL (ref 6–20)
Bilirubin Total: <0.2 mg/dL (ref 0.0–1.2)
CO2: 20 mmol/L (ref 20–29)
Calcium: 8.8 mg/dL (ref 8.7–10.2)
Chloride: 105 mmol/L (ref 96–106)
Creatinine, Ser: 0.59 mg/dL (ref 0.57–1.00)
GFR calc Af Amer: 140 mL/min/{1.73_m2} (ref >59)
GFR calc non Af Amer: 122 mL/min/{1.73_m2} (ref >59)
Globulin, Total: 2.8 g/dL (ref 1.5–4.5)
Glucose: 115 mg/dL — ABNORMAL HIGH (ref 65–99)
Potassium: 4.4 mmol/L (ref 3.5–5.2)
Sodium: 139 mmol/L (ref 134–144)
Total Protein: 6.5 g/dL (ref 6.0–8.5)

## 2019-12-10 LAB — LIPID PANEL
Chol/HDL Ratio: 3.9 ratio (ref 0.0–4.4)
Cholesterol, Total: 160 mg/dL (ref 100–199)
HDL: 41 mg/dL (ref >39)
LDL Chol Calc (NIH): 104 mg/dL — ABNORMAL HIGH (ref 0–99)
Triglycerides: 78 mg/dL (ref 0–149)
VLDL Cholesterol Cal: 15 mg/dL (ref 5–40)

## 2019-12-12 ENCOUNTER — Ambulatory Visit (HOSPITAL_COMMUNITY): Payer: No Typology Code available for payment source | Admitting: Psychiatry

## 2019-12-13 ENCOUNTER — Encounter (HOSPITAL_COMMUNITY): Payer: Self-pay | Admitting: Psychiatry

## 2019-12-13 ENCOUNTER — Telehealth (INDEPENDENT_AMBULATORY_CARE_PROVIDER_SITE_OTHER): Payer: No Typology Code available for payment source | Admitting: Psychiatry

## 2019-12-13 ENCOUNTER — Ambulatory Visit (INDEPENDENT_AMBULATORY_CARE_PROVIDER_SITE_OTHER): Payer: No Typology Code available for payment source | Admitting: Family Medicine

## 2019-12-13 ENCOUNTER — Encounter: Payer: Self-pay | Admitting: Family Medicine

## 2019-12-13 ENCOUNTER — Other Ambulatory Visit: Payer: Self-pay

## 2019-12-13 VITALS — BP 122/74 | HR 81 | Temp 98.1°F | Resp 16 | Ht 68.0 in | Wt 291.4 lb

## 2019-12-13 DIAGNOSIS — Z6841 Body Mass Index (BMI) 40.0 and over, adult: Secondary | ICD-10-CM

## 2019-12-13 DIAGNOSIS — K219 Gastro-esophageal reflux disease without esophagitis: Secondary | ICD-10-CM | POA: Diagnosis not present

## 2019-12-13 DIAGNOSIS — F419 Anxiety disorder, unspecified: Secondary | ICD-10-CM

## 2019-12-13 DIAGNOSIS — J4520 Mild intermittent asthma, uncomplicated: Secondary | ICD-10-CM | POA: Diagnosis not present

## 2019-12-13 DIAGNOSIS — R7301 Impaired fasting glucose: Secondary | ICD-10-CM

## 2019-12-13 DIAGNOSIS — F32A Depression, unspecified: Secondary | ICD-10-CM

## 2019-12-13 DIAGNOSIS — F4310 Post-traumatic stress disorder, unspecified: Secondary | ICD-10-CM | POA: Diagnosis not present

## 2019-12-13 DIAGNOSIS — F330 Major depressive disorder, recurrent, mild: Secondary | ICD-10-CM | POA: Diagnosis not present

## 2019-12-13 DIAGNOSIS — F329 Major depressive disorder, single episode, unspecified: Secondary | ICD-10-CM

## 2019-12-13 MED ORDER — ONDANSETRON 4 MG PO TBDP: 4.0000 mg | ORAL_TABLET | Freq: Three times a day (TID) | ORAL | 3 refills | Status: AC | PRN

## 2019-12-13 MED ORDER — PHENTERMINE HCL 37.5 MG PO TABS: 37.5000 mg | ORAL_TABLET | Freq: Every day | ORAL | 0 refills | Status: AC

## 2019-12-13 MED ORDER — PANTOPRAZOLE SODIUM 40 MG PO TBEC: 40.0000 mg | DELAYED_RELEASE_TABLET | Freq: Every day | ORAL | 0 refills | Status: DC

## 2019-12-13 MED ORDER — ALBUTEROL SULFATE HFA 108 (90 BASE) MCG/ACT IN AERS: 1.0000 | INHALATION_SPRAY | Freq: Four times a day (QID) | RESPIRATORY_TRACT | 3 refills | Status: AC | PRN

## 2019-12-13 MED ORDER — DESVENLAFAXINE SUCCINATE ER 100 MG PO TB24: 100.0000 mg | ORAL_TABLET | Freq: Every day | ORAL | 0 refills | Status: DC

## 2019-12-13 MED FILL — PHENTERMINE 37.5 MG TABLET: 37.5 | 30 days supply | Qty: 30 | Fill #0

## 2019-12-13 MED FILL — ONDANSETRON ODT 4 MG TABLET: 4 | 4 days supply | Qty: 12 | Fill #0

## 2019-12-13 NOTE — Progress Notes (Signed)
Virtual Visit via Telephone Note  I connected with Kathleen Lambert on 12/13/19 at  3:40 PM EDT by telephone and verified that I am speaking with the correct person using two identifiers.   I discussed the limitations, risks, security and privacy concerns of performing an evaluation and management service by telephone and the availability of in person appointments. I also discussed with the patient that there may be a patient responsible charge related to this service. The patient expressed understanding and agreed to proceed.   History of Present Illness: Patient is evaluated through phone session.  She is doing well on her current medication.  She had a last session with Annette Stable for EMDR.  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  She feels good about it.  She denies any panic attack, crying spells or any feeling of hopelessness.  She sleeps good overall but does require trazodone once a week if needed.  She has not taken lorazepam since the last visit.  Her appetite is okay.  Her energy level is good.  She has no tremors, shakes or any EPS.   Past Psychiatric History: H/Odepression andPTSD.Takingantidepressants since January 2019. Tried Lexapro, Wellbutrin, Cymbalta, Abilify and Zoloft. Recently hydroxyzine with poor response.No history of psychiatric inpatient treatment, suicidal attempt, mania, psychosis or any hallucination.We did gene site testing and results reviewed.   Psychiatric Specialty Exam: Physical Exam  Review of Systems  There were no vitals taken for this visit.There is no height or weight on file to calculate BMI.  General Appearance: NA  Eye Contact:  NA  Speech:  Normal Rate  Volume:  Normal  Mood:  Euthymic  Affect:  NA  Thought Process:  Goal Directed  Orientation:  Full (Time, Place, and Person)  Thought Content:  Logical  Suicidal Thoughts:  No  Homicidal  Thoughts:  No  Memory:  Immediate;   Good Recent;   Good Remote;   Good  Judgement:  Good  Insight:  Good  Psychomotor Activity:  NA  Concentration:  Concentration: Good and Attention Span: Good  Recall:  Good  Fund of Knowledge:  Good  Language:  Good  Akathisia:  No  Handed:  Right  AIMS (if indicated):     Assets:  Communication Skills Desire for Improvement Housing Physical Health Resilience Social Support Talents/Skills Transportation  ADL's:  Intact  Cognition:  WNL  Sleep:   ok      Assessment and Plan: Major depressive disorder, recurrent.  PTSD.  Patient is stable on Pristiq 100 mg daily.  She takes trazodone occasionally and does not need a new prescription at this time.  She is hoping to stay in [place] for a while but not sure how long.  She like to have a follow-up appointment in 3 months at this time she has more clarification about her insurance and future job assignment.  I discussed medication side effects and benefits.  Recommended to call us back if she has any questions or any concerns.  Follow-up in 3 months.  Follow Up Instructions:    I discussed the assessment and treatment plan with the patient. The patient was provided an opportunity to ask questions and all were answered. The patient agreed with the plan and demonstrated an understanding of the instructions.   The patient was advised to call back or seek an in-person evaluation if the symptoms worsen or  if the condition fails to improve as anticipated.  I provided 15 minutes of non-face-to-face time during this encounter.   Cleotis Nipper, MD

## 2019-12-13 NOTE — Patient Instructions (Signed)
° ° ° °  If you have lab work done today you will be contacted with your lab results within the next 2 weeks.  If you have not heard from us then please contact us. The fastest way to get your results is to register for My Chart. ° ° °IF you received an x-ray today, you will receive an invoice from [facility]. Please contact [facility] at [phone] with questions or concerns regarding your invoice.  ° °IF you received labwork today, you will receive an invoice from [facility]. Please contact [facility] at [phone] with questions or concerns regarding your invoice.  ° °Our billing staff will not be able to assist you with questions regarding bills from these companies. ° °You will be contacted with the lab results as soon as they are available. The fastest way to get your results is to activate your My Chart account. Instructions are located on the last page of this paperwork. If you have not heard from us regarding the results in 2 weeks, please contact this office. °  ° ° ° °

## 2019-12-13 NOTE — Progress Notes (Signed)
5/6/20212:25 PM  Kathleen Lambert 1986/01/06, 34 y.o., female [id number]  Chief Complaint  Patient presents with  . Annual Exam    pt has no concerns     HPI:   Patient is a 34 y.o. female with past medical history significant for GERD, depression and PTSD, who presents today for medication refill  Patient is moving to [facility] next week and is requesting for refills of medications  She is also wondering about phentermine Has gained sign weight this past year She has used phentermine in the past for about 1-2 months and was able to get portion size under control and continue with LFM after dc medication She denies any side effects of medication  Cont to see Dr Lolly Mustache, psych, last OV today telemedicine rx pristiq  Fasting glucose 115  Mild intermittent asthma - worse with seasonal allergies Uses albuterol about twice a week since pollen counts has been high, usually once every 2 weeks, SOB with exertion only  GERD well controlled with daily protonix EGD sept 2020 - small HH,   Had covid in sept 2020 - did ok Has completed covid vaccines   Lab Results  Component Value Date   CREATININE 0.59 12/10/2019   BUN 15 12/10/2019   NA 139 12/10/2019   K 4.4 12/10/2019   CL 105 12/10/2019   CO2 20 12/10/2019   Lab Results  Component Value Date   ALT 16 12/10/2019   AST 11 12/10/2019   ALKPHOS 99 12/10/2019   BILITOT <0.2 12/10/2019   Lab Results  Component Value Date   CHOL 160 12/10/2019   HDL 41 12/10/2019   LDLCALC 104 (H) 12/10/2019   TRIG 78 12/10/2019   CHOLHDL 3.9 12/10/2019     Depression screen PHQ 2/9 12/13/2019 09/18/2019 03/22/2019  Decreased Interest 0 0 0  Down, Depressed, Hopeless 0 0 0  PHQ - 2 Score 0 0 0  Altered sleeping - - -  Tired, decreased energy - - -  Change in appetite - - -  Feeling bad or failure about yourself  - - -  Trouble concentrating - - -  Moving slowly or fidgety/restless - - -  Suicidal thoughts - - -  PHQ-9 Score - - -   Difficult doing work/chores - - -  Some recent data might be hidden    Fall Risk  12/13/2019 09/18/2019 03/22/2019 01/24/2019 10/04/2018  Falls in the past year? 0 1 0 0 0  Number falls in past yr: - 0 0 0 0  Injury with Fall? - 1 0 0 0  Follow up - Falls evaluation completed - - -     Allergies  Allergen Reactions  . Augmentin [Amoxicillin-Pot Clavulanate] Nausea Only    Prior to Admission medications   Medication Sig Start Date End Date Taking? Authorizing Provider  albuterol (VENTOLIN HFA) 108 (90 Base) MCG/ACT inhaler Inhale 1 puff into the lungs every 6 (six) hours as needed for wheezing or shortness of breath. 11/06/19  Yes Myles Lipps, MD  desvenlafaxine (PRISTIQ) 100 MG 24 hr tablet Take 1 tablet (100 mg total) by mouth daily. 10/03/19  Yes Arfeen, Phillips Grout, MD  ondansetron (ZOFRAN ODT) 4 MG disintegrating tablet Take 1 tablet (4 mg total) by mouth every 8 (eight) hours as needed for nausea or vomiting. 04/02/19  Yes Myles Lipps, MD  pantoprazole (PROTONIX) 40 MG tablet Take 1 tablet (40 mg total) by mouth daily. 11/06/19  Yes Myles Lipps, MD  LORazepam (  ATIVAN) 0.5 MG tablet Take 1 tablet (0.5 mg total) by mouth daily as needed for anxiety. Patient not taking: Reported on 12/13/2019 06/25/19 06/24/20  Arfeen, Phillips Grout, MD  meloxicam (MOBIC) 15 MG tablet Take 1 tablet (15 mg total) by mouth daily. Patient not taking: Reported on 12/13/2019 09/18/19   Janeece Agee, NP  methocarbamol (ROBAXIN) 500 MG tablet Take 1 tablet (500 mg total) by mouth 4 (four) times daily. Patient not taking: Reported on 12/13/2019 09/18/19   Janeece Agee, NP  traZODone (DESYREL) 50 MG tablet Take 1 tablet (50 mg total) by mouth at bedtime. Patient not taking: Reported on 12/13/2019 06/25/19   Cleotis Nipper, MD    Past Medical History:  Diagnosis Date  . Allergy   . Anxiety   . Contraceptive management   . Depression   . Frequent headaches   . GERD (gastroesophageal reflux disease)   . Meralgia  paraesthetica 2019    Past Surgical History:  Procedure Laterality Date  . WISDOM TOOTH EXTRACTION      Social History   Tobacco Use  . Smoking status: Never Smoker  . Smokeless tobacco: Never Used  Substance Use Topics  . Alcohol use: Yes    Alcohol/week: 1.0 standard drinks    Types: 1 Standard drinks or equivalent per week    Comment: social rare    Family History  Problem Relation Age of Onset  . Mental illness Mother        borderline personality  . Hypertension Father   . Hyperlipidemia Father   . Breast cancer Paternal Aunt   . Lung cancer Paternal Uncle   . Diabetes Maternal Grandmother   . Heart disease Maternal Grandmother   . Hypertension Maternal Grandmother   . Heart disease Maternal Grandfather   . Stroke Paternal Grandfather   . Heart disease Paternal Grandfather   . Colon cancer Neg Hx   . Esophageal cancer Neg Hx   . Stomach cancer Neg Hx   . Rectal cancer Neg Hx     Review of Systems  Constitutional: Negative for chills and fever.  Respiratory: Positive for shortness of breath. Negative for cough.   Cardiovascular: Negative for chest pain, palpitations and leg swelling.  Gastrointestinal: Negative for abdominal pain, nausea and vomiting.     OBJECTIVE:  Today's Vitals   12/13/19 1413  BP: 122/74  Pulse: 81  Resp: 16  Temp: 98.1 F (36.7 C)  TempSrc: Temporal  SpO2: 95%  Weight: 291 lb 6.4 oz (132.2 kg)  Height: 5\' 8"  (1.727 m)   Body mass index is 44.31 kg/m.  Wt Readings from Last 3 Encounters:  12/13/19 291 lb 6.4 oz (132.2 kg)  09/18/19 285 lb 12.8 oz (129.6 kg)  05/01/19 263 lb (119.3 kg)    Physical Exam Vitals and nursing note reviewed.  Constitutional:      Appearance: She is well-developed.  HENT:     Head: Normocephalic and atraumatic.     Mouth/Throat:     Pharynx: No oropharyngeal exudate.  Eyes:     General: No scleral icterus.    Conjunctiva/sclera: Conjunctivae normal.     Pupils: Pupils are equal,  round, and reactive to light.  Cardiovascular:     Rate and Rhythm: Normal rate and regular rhythm.     Heart sounds: Normal heart sounds. No murmur. No friction rub. No gallop.   Pulmonary:     Effort: Pulmonary effort is normal.     Breath sounds: Normal breath sounds. No  wheezing or rales.  Musculoskeletal:     Cervical back: Neck supple.  Skin:    General: Skin is warm and dry.  Neurological:     Mental Status: She is alert and oriented to person, place, and time.     No results found for this or any previous visit (from the past 24 hour(s)).  No results found.   ASSESSMENT and PLAN  1. Gastroesophageal reflux disease without esophagitis Controlled. Continue current regime.   2. BMI 40.0-44.9, adult (HCC) Discussed LFM, phentermine x 1 month. Reviewed r/se/b. pmp reviewed  3. Elevated fasting glucose - Hemoglobin A1c  4. Mild intermittent asthma without complication Stable. Albuterol prn.   5. Anxiety and depression Managed by psych, overall stable  Other orders - pantoprazole (PROTONIX) 40 MG tablet; Take 1 tablet (40 mg total) by mouth daily. - albuterol (VENTOLIN HFA) 108 (90 Base) MCG/ACT inhaler; Inhale 1 puff into the lungs every 6 (six) hours as needed for wheezing or shortness of breath. - ondansetron (ZOFRAN ODT) 4 MG disintegrating tablet; Take 1 tablet (4 mg total) by mouth every 8 (eight) hours as needed for nausea or vomiting. - phentermine (ADIPEX-P) 37.5 MG tablet; Take 1 tablet (37.5 mg total) by mouth daily before breakfast.  No follow-ups on file.    Rutherford Guys, MD [facility] [place], [place] [address] Ph.  [phone] Fax [phone]

## 2019-12-14 LAB — HEMOGLOBIN A1C
Est. average glucose Bld gHb Est-mCnc: 117 mg/dL
Hgb A1c MFr Bld: 5.7 % — ABNORMAL HIGH (ref 4.8–5.6)

## 2020-01-11 ENCOUNTER — Encounter: Payer: Self-pay | Admitting: Family Medicine

## 2020-01-11 ENCOUNTER — Other Ambulatory Visit: Payer: Self-pay

## 2020-01-11 ENCOUNTER — Telehealth: Payer: Self-pay

## 2020-01-11 DIAGNOSIS — F4310 Post-traumatic stress disorder, unspecified: Secondary | ICD-10-CM

## 2020-01-11 DIAGNOSIS — F330 Major depressive disorder, recurrent, mild: Secondary | ICD-10-CM

## 2020-01-11 MED ORDER — DESVENLAFAXINE SUCCINATE ER 100 MG PO TB24: 100.0000 mg | ORAL_TABLET | Freq: Every day | ORAL | 2 refills | Status: AC

## 2020-01-11 MED ORDER — PANTOPRAZOLE SODIUM 40 MG PO TBEC: 40.0000 mg | DELAYED_RELEASE_TABLET | Freq: Every day | ORAL | 2 refills | Status: AC

## 2020-01-11 NOTE — Telephone Encounter (Signed)
PT has moved to [facility]. Pt requesting 3 month refill of her Protonix and Pristiq, she has bad insurance at the moment and cannot see a new PCP where she is in [place].  Is this a possibility?

## 2020-01-11 NOTE — Telephone Encounter (Signed)
Pt called back and let her know that she can see the message that Dr. Leretha Pol left. On her MyChart regarding the proscriptions. Also read message to Pt.

## 2020-01-11 NOTE — Telephone Encounter (Signed)
Pt rx of pristiq [facility] [place], [place] [phone]

## 2020-03-13 ENCOUNTER — Telehealth (HOSPITAL_COMMUNITY): Payer: No Typology Code available for payment source | Admitting: Psychiatry

## 2020-07-18 IMAGING — US US THYROID
1 series · 14 of 25 positions shown · non-contrast
Comparison: None.

CLINICAL DATA: Palpable abnormality. 33-year-old female with
thyromegaly on physical exam

EXAM:
THYROID ULTRASOUND
TECHNIQUE: Ultrasound examination of the thyroid gland and adjacent soft
tissues was performed.

[Series 1: us thyroid · 0.08mm/px · 14 of 51 slices shown]
[im 1/51]
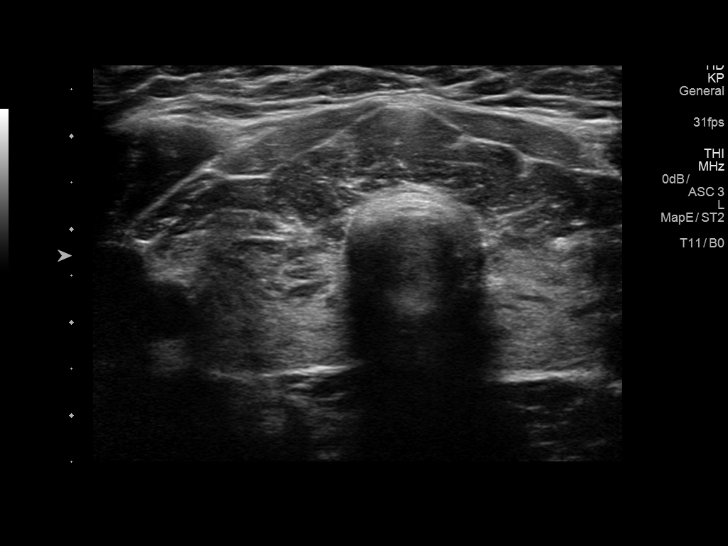
[im 5/51]
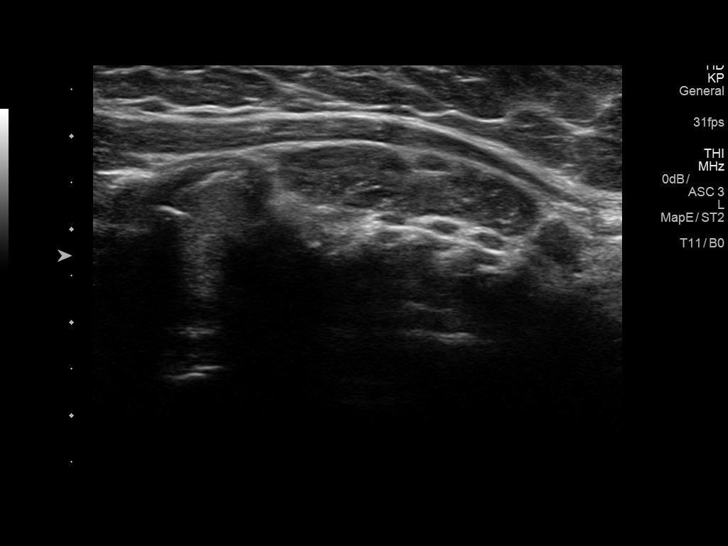
[im 9/51]
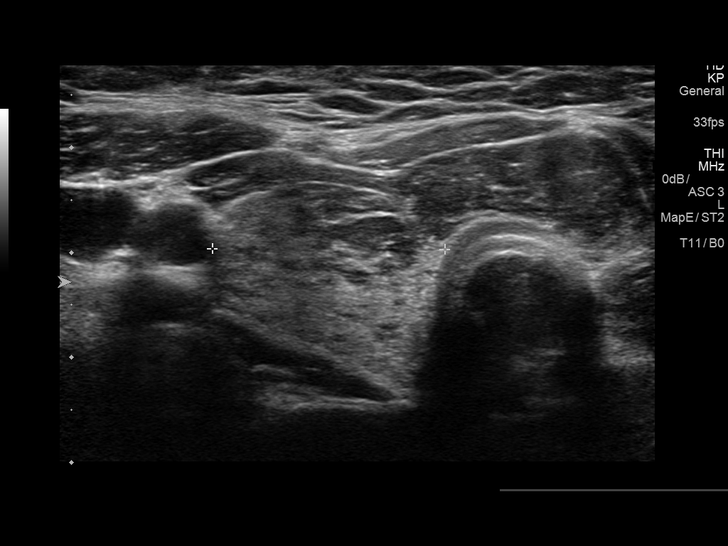
[im 13/51]
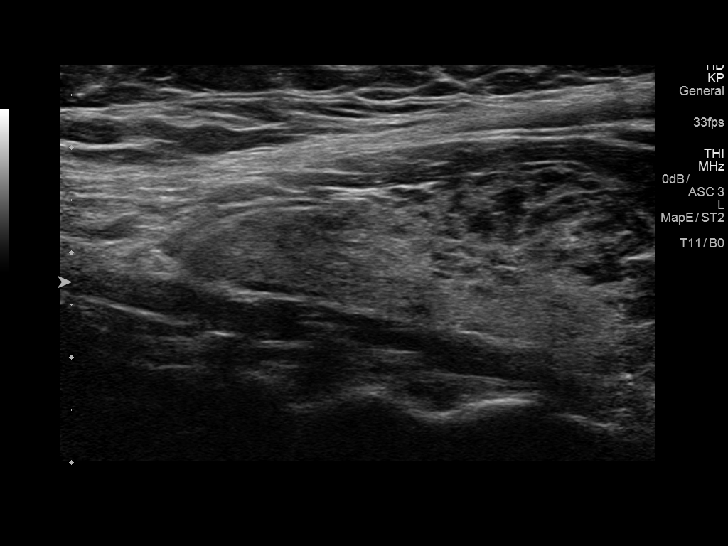
[im 17/51]
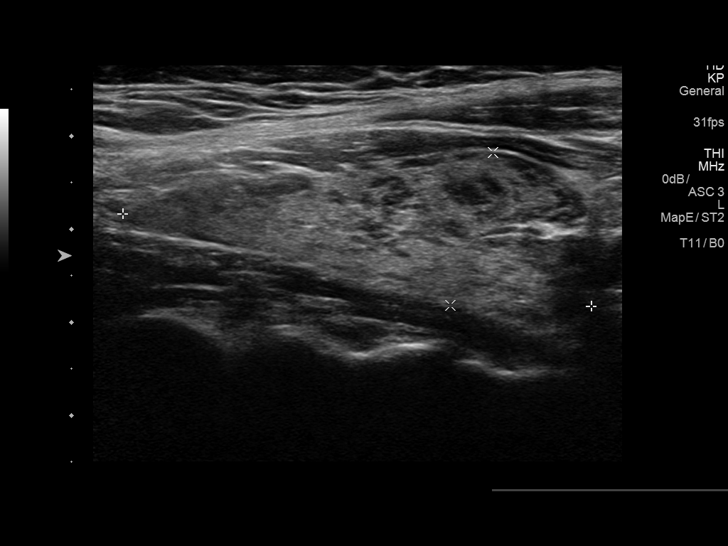
[im 19/51]
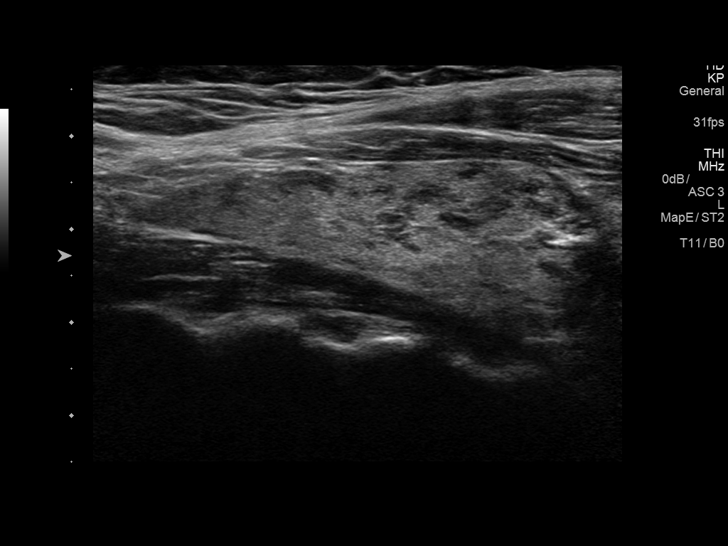
[im 23/51]
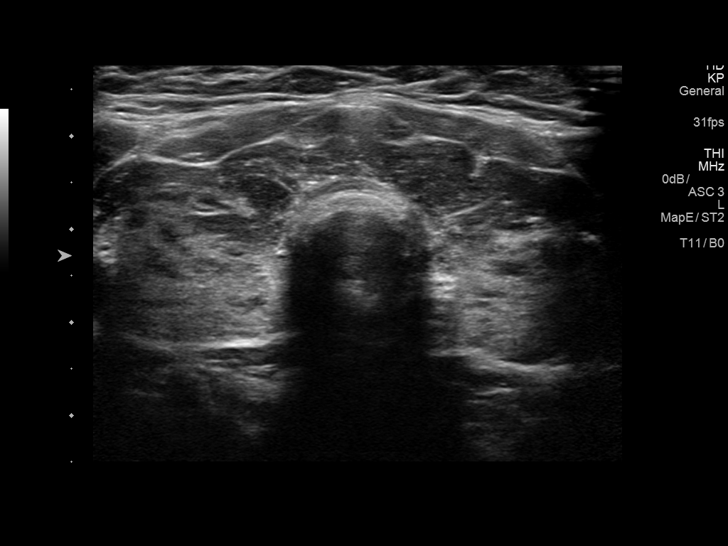
[im 28/51]
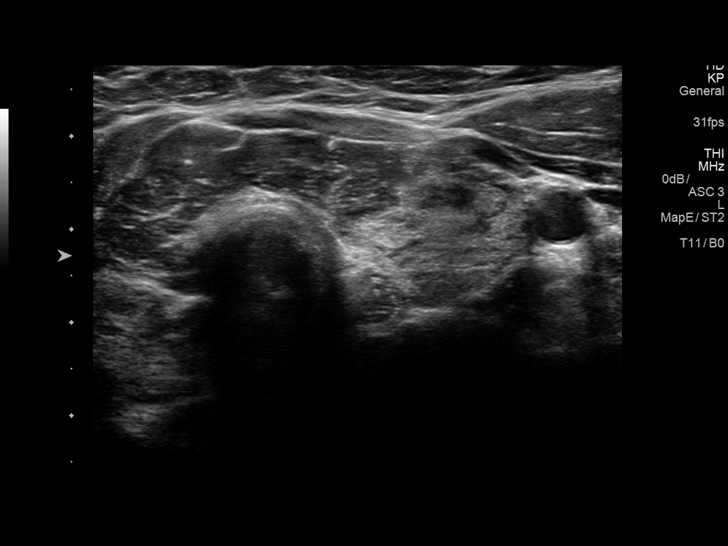
[im 32/51]
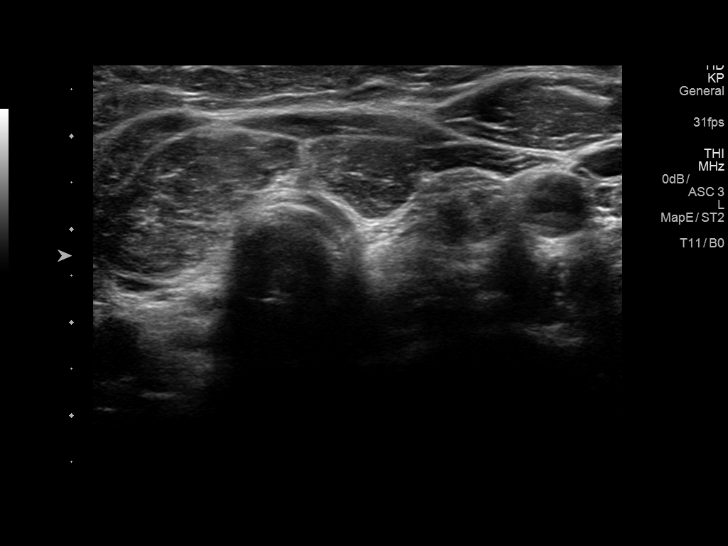
[im 34/51]
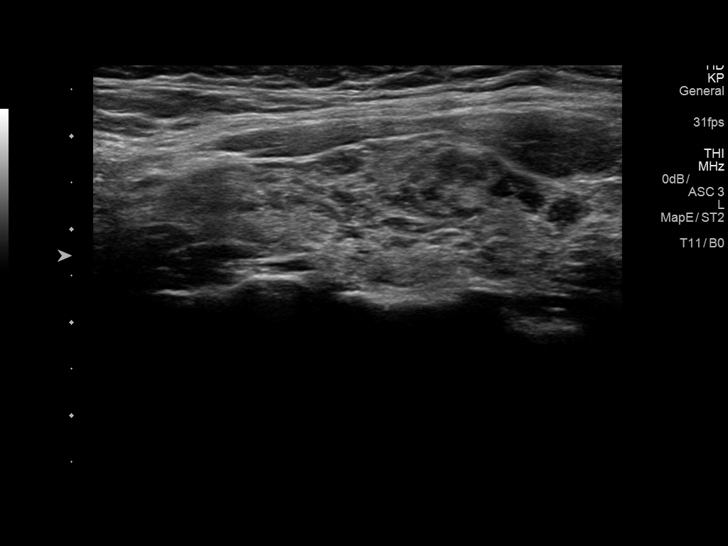
[im 38/51]
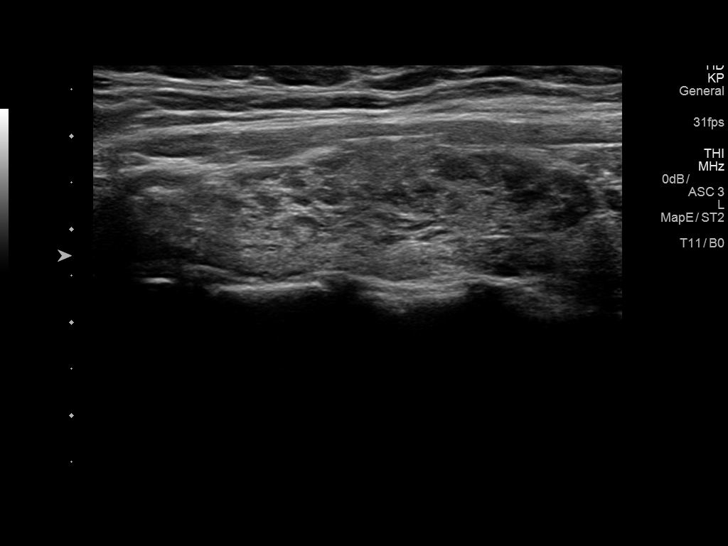
[im 42/51]
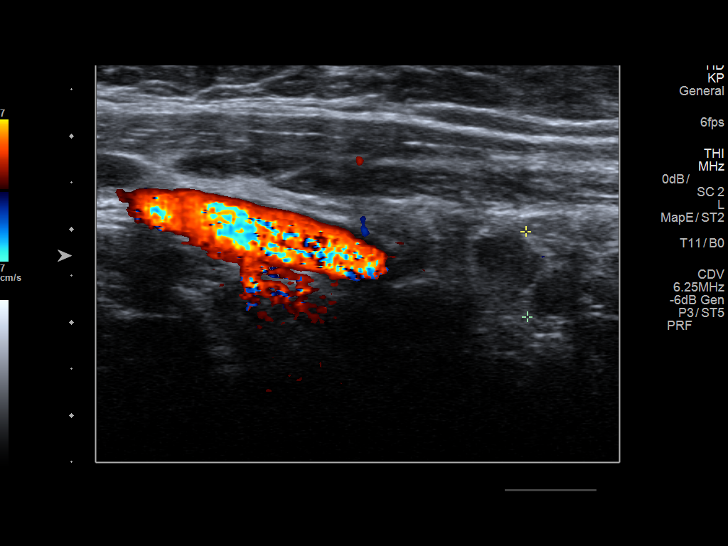
[im 46/51]
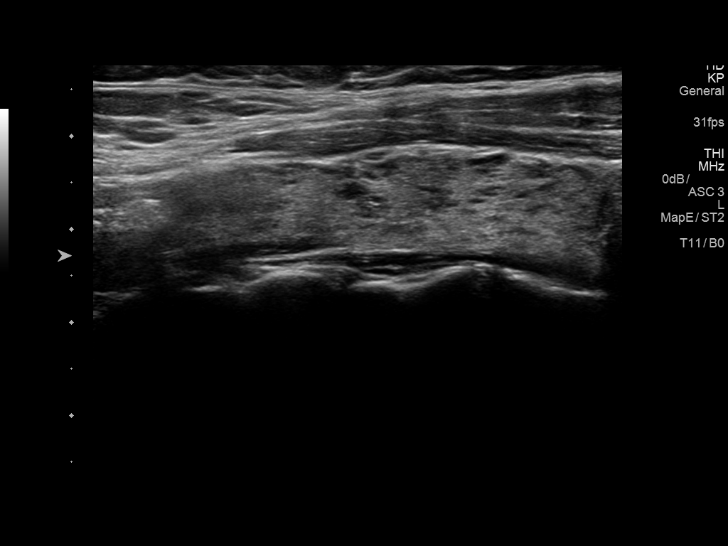
[im 51/51]
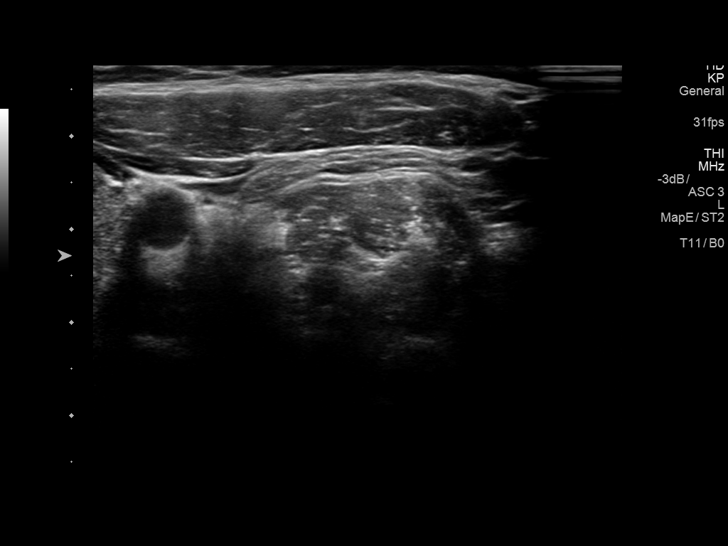

[14 of 25 positions shown; findings below may reference images not displayed]

FINDINGS: Parenchymal Echotexture: Markedly heterogenous

Isthmus: 0.8 cm

Right lobe: 5.1 x 1.7 x 2.2 cm

Left lobe: 5.1 x 1.4 x 2.2 cm

_________________________________________________________

Estimated total number of nodules >/= 1 cm: 0

Number of spongiform nodules >/=  2 cm not described below (TR1): 0

Number of mixed cystic and solid nodules >/= 1.5 cm not described
below (TR2): 0

_________________________________________________________

Diffusely heterogeneous, and lobular thyroid gland without discrete
thyroid nodule.
IMPRESSION: 1. Diffusely enlarged, heterogeneous and lobular thyroid gland most
consistent with either chronic thyroiditis or diffuse goitrous
change.
2. No discrete thyroid nodules are identified.

## 2021-10-28 IMAGING — DX DG KNEE COMPLETE 4+V*L*
4 series · 4 of 4 positions shown · non-contrast
Comparison: None.

CLINICAL DATA: Left knee pain

EXAM:
LEFT KNEE - COMPLETE 4+ VIEW

[knee ap]
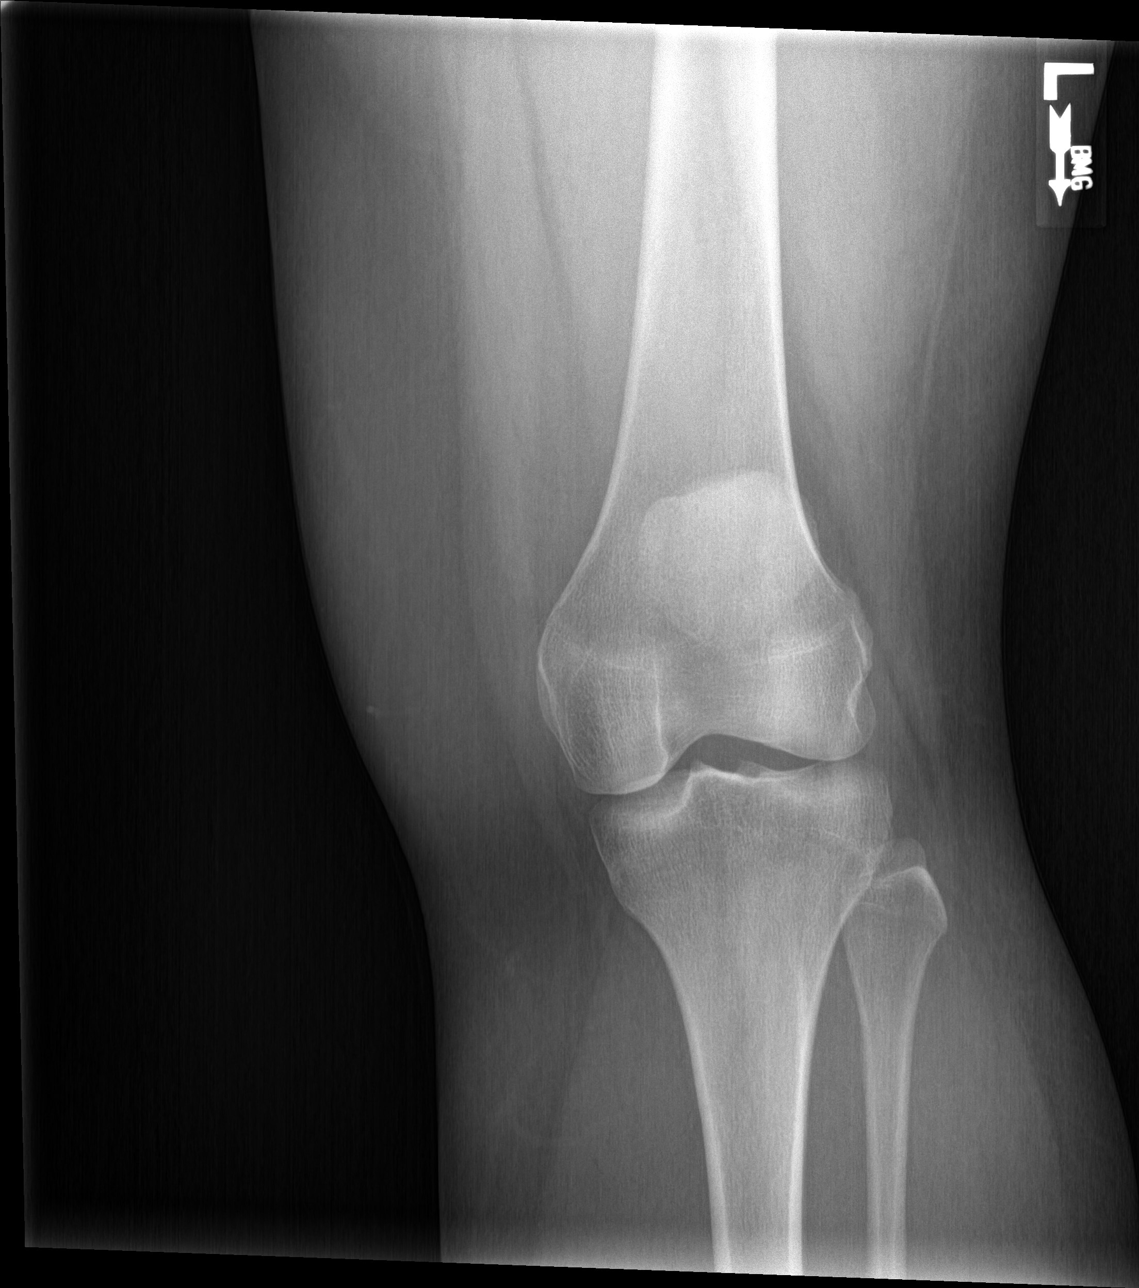

[knee lat]
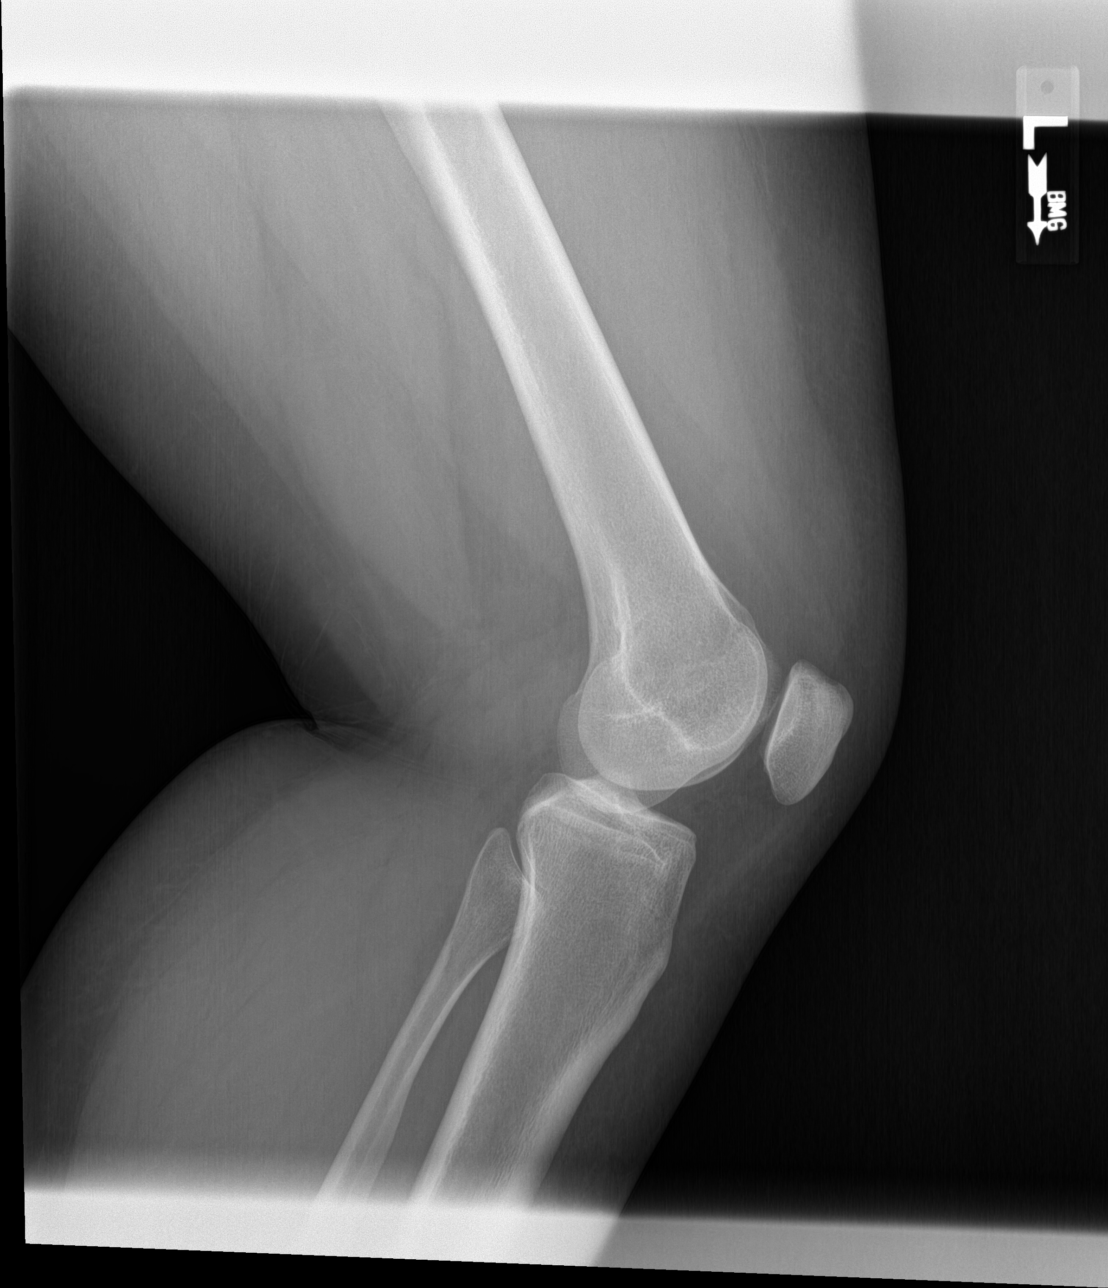

[sunrise]
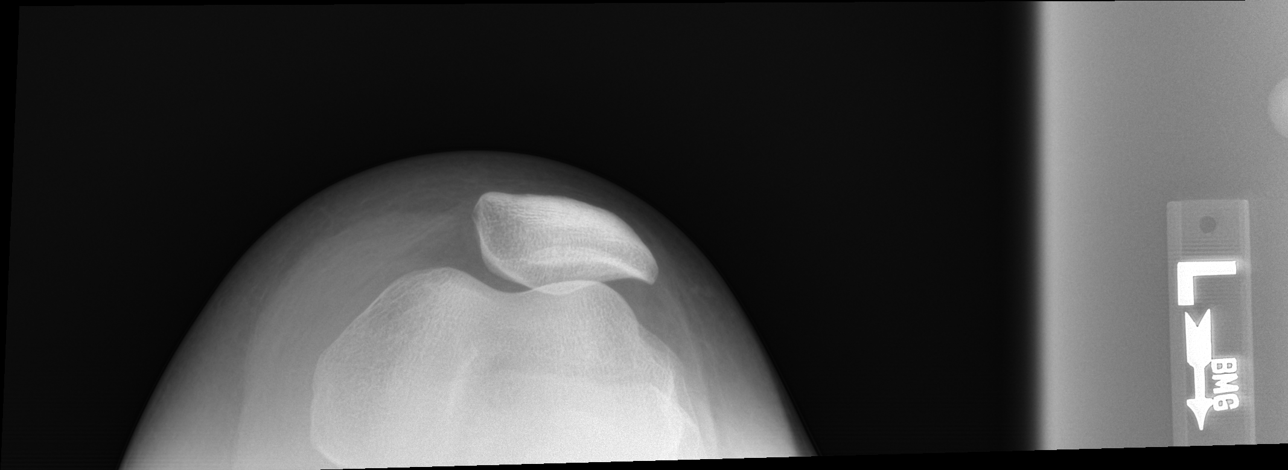

[knee [person_name]]
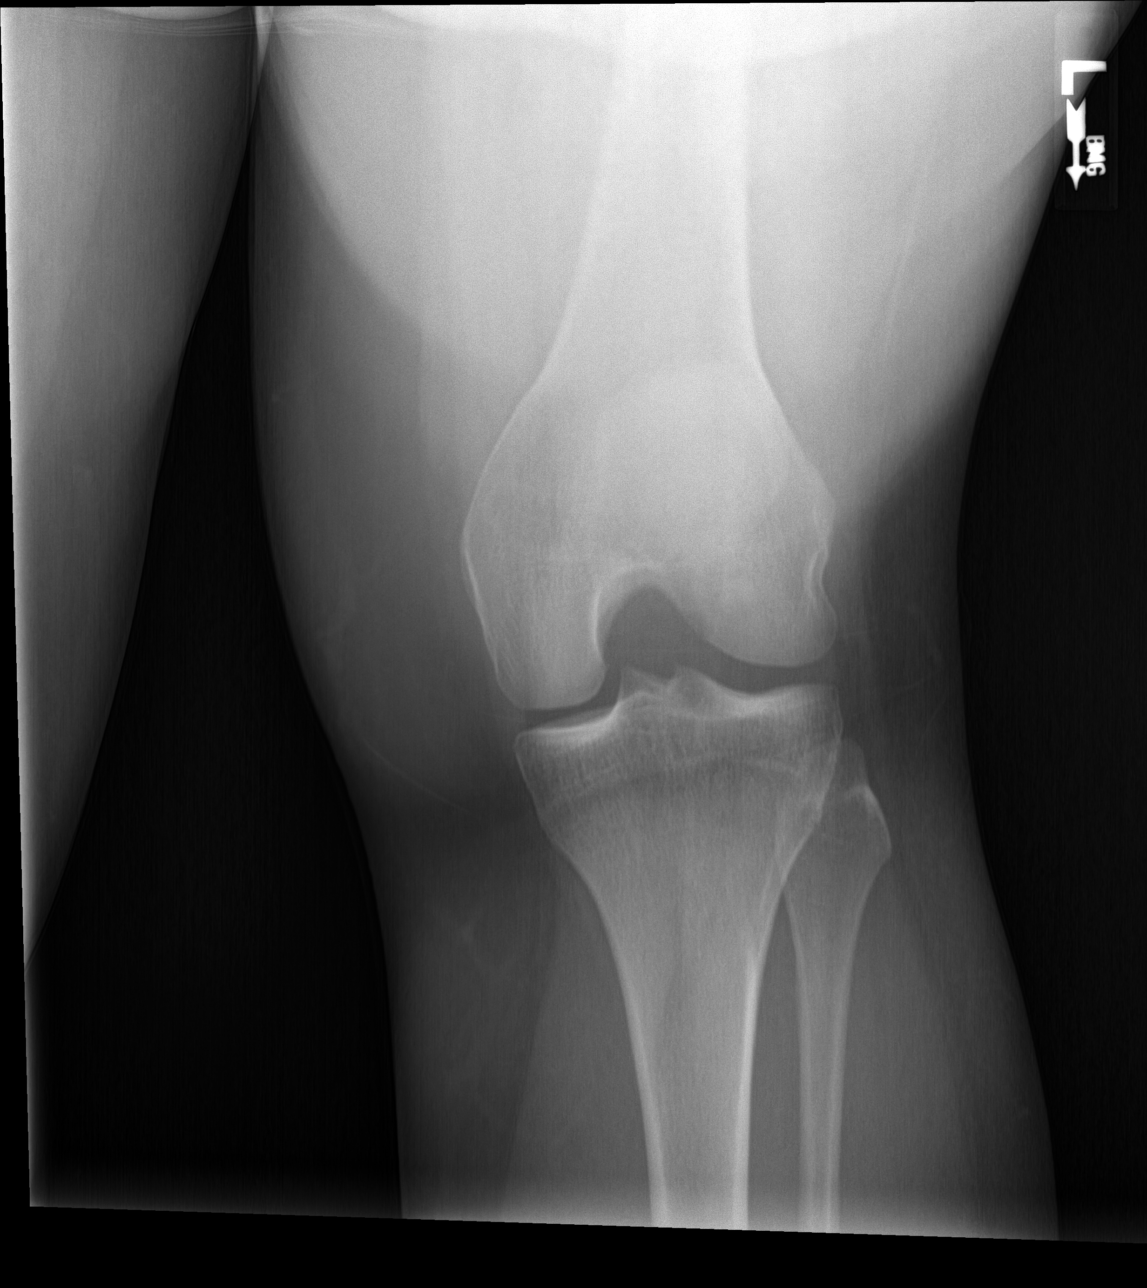

[4 of 4 positions shown; findings below may reference images not displayed]

FINDINGS: No evidence of fracture, dislocation, or joint effusion. No evidence
of arthropathy or other focal bone abnormality. Soft tissues are
unremarkable.
IMPRESSION: No acute abnormality noted.
